# Patient Record
Sex: Male | Born: 1960 | Race: Black or African American | Hispanic: No | Marital: Married | Smoking: Former smoker
Health system: Southern US, Community
[De-identification: ages and names within clinical notes are randomized; demographics above are authoritative.]

## PROBLEM LIST (undated history)

## (undated) DIAGNOSIS — M109 Gout, unspecified: Secondary | ICD-10-CM

## (undated) DIAGNOSIS — D696 Thrombocytopenia, unspecified: Secondary | ICD-10-CM

## (undated) DIAGNOSIS — Z9289 Personal history of other medical treatment: Secondary | ICD-10-CM

## (undated) DIAGNOSIS — C61 Malignant neoplasm of prostate: Secondary | ICD-10-CM

## (undated) DIAGNOSIS — Z9359 Other cystostomy status: Secondary | ICD-10-CM

## (undated) DIAGNOSIS — F1020 Alcohol dependence, uncomplicated: Secondary | ICD-10-CM

## (undated) DIAGNOSIS — N400 Enlarged prostate without lower urinary tract symptoms: Secondary | ICD-10-CM

## (undated) DIAGNOSIS — N493 Fournier gangrene: Secondary | ICD-10-CM

## (undated) DIAGNOSIS — C91Z0 Other lymphoid leukemia not having achieved remission: Secondary | ICD-10-CM

## (undated) DIAGNOSIS — D509 Iron deficiency anemia, unspecified: Secondary | ICD-10-CM

## (undated) DIAGNOSIS — I10 Essential (primary) hypertension: Secondary | ICD-10-CM

## (undated) DIAGNOSIS — K259 Gastric ulcer, unspecified as acute or chronic, without hemorrhage or perforation: Secondary | ICD-10-CM

## (undated) DIAGNOSIS — N529 Male erectile dysfunction, unspecified: Secondary | ICD-10-CM

## (undated) DIAGNOSIS — C9100 Acute lymphoblastic leukemia not having achieved remission: Secondary | ICD-10-CM

## (undated) DIAGNOSIS — E43 Unspecified severe protein-calorie malnutrition: Secondary | ICD-10-CM

## (undated) HISTORY — PX: PROSTATE BIOPSY: SHX241

---

## 2016-11-05 DIAGNOSIS — N493 Fournier gangrene: Secondary | ICD-10-CM

## 2016-11-05 HISTORY — DX: Fournier gangrene: N49.3

## 2016-11-05 HISTORY — PX: SCROTAL SURGERY: SHX2387

## 2016-11-05 HISTORY — PX: SUPRAPUBIC CATHETER PLACEMENT: SHX2473

## 2016-11-05 HISTORY — PX: COLOSTOMY: SHX63

## 2016-12-14 ENCOUNTER — Observation Stay (HOSPITAL_COMMUNITY): Payer: Managed Care, Other (non HMO)

## 2016-12-14 ENCOUNTER — Observation Stay (HOSPITAL_COMMUNITY)
Admission: AD | Admit: 2016-12-14 | Discharge: 2017-01-03 | Disposition: E | Payer: Managed Care, Other (non HMO) | Source: Other Acute Inpatient Hospital | Attending: Internal Medicine | Admitting: Internal Medicine

## 2016-12-14 ENCOUNTER — Encounter (HOSPITAL_COMMUNITY): Payer: Self-pay | Admitting: General Practice

## 2016-12-14 DIAGNOSIS — J439 Emphysema, unspecified: Secondary | ICD-10-CM | POA: Diagnosis not present

## 2016-12-14 DIAGNOSIS — N360 Urethral fistula: Secondary | ICD-10-CM | POA: Diagnosis not present

## 2016-12-14 DIAGNOSIS — E871 Hypo-osmolality and hyponatremia: Secondary | ICD-10-CM | POA: Insufficient documentation

## 2016-12-14 DIAGNOSIS — C9101 Acute lymphoblastic leukemia, in remission: Secondary | ICD-10-CM | POA: Insufficient documentation

## 2016-12-14 DIAGNOSIS — I10 Essential (primary) hypertension: Secondary | ICD-10-CM | POA: Diagnosis not present

## 2016-12-14 DIAGNOSIS — D509 Iron deficiency anemia, unspecified: Secondary | ICD-10-CM | POA: Insufficient documentation

## 2016-12-14 DIAGNOSIS — R0602 Shortness of breath: Secondary | ICD-10-CM

## 2016-12-14 DIAGNOSIS — Z66 Do not resuscitate: Secondary | ICD-10-CM | POA: Insufficient documentation

## 2016-12-14 DIAGNOSIS — F1020 Alcohol dependence, uncomplicated: Secondary | ICD-10-CM | POA: Insufficient documentation

## 2016-12-14 DIAGNOSIS — Z8719 Personal history of other diseases of the digestive system: Secondary | ICD-10-CM | POA: Diagnosis not present

## 2016-12-14 DIAGNOSIS — H4921 Sixth [abducent] nerve palsy, right eye: Secondary | ICD-10-CM | POA: Diagnosis not present

## 2016-12-14 DIAGNOSIS — Z95828 Presence of other vascular implants and grafts: Secondary | ICD-10-CM | POA: Diagnosis not present

## 2016-12-14 DIAGNOSIS — N400 Enlarged prostate without lower urinary tract symptoms: Secondary | ICD-10-CM | POA: Diagnosis not present

## 2016-12-14 DIAGNOSIS — D696 Thrombocytopenia, unspecified: Secondary | ICD-10-CM | POA: Insufficient documentation

## 2016-12-14 DIAGNOSIS — F4323 Adjustment disorder with mixed anxiety and depressed mood: Secondary | ICD-10-CM | POA: Diagnosis not present

## 2016-12-14 DIAGNOSIS — E43 Unspecified severe protein-calorie malnutrition: Secondary | ICD-10-CM | POA: Insufficient documentation

## 2016-12-14 DIAGNOSIS — Z9221 Personal history of antineoplastic chemotherapy: Secondary | ICD-10-CM | POA: Insufficient documentation

## 2016-12-14 DIAGNOSIS — R29898 Other symptoms and signs involving the musculoskeletal system: Secondary | ICD-10-CM

## 2016-12-14 DIAGNOSIS — E876 Hypokalemia: Secondary | ICD-10-CM | POA: Insufficient documentation

## 2016-12-14 DIAGNOSIS — Z87891 Personal history of nicotine dependence: Secondary | ICD-10-CM | POA: Insufficient documentation

## 2016-12-14 DIAGNOSIS — Z933 Colostomy status: Secondary | ICD-10-CM | POA: Insufficient documentation

## 2016-12-14 DIAGNOSIS — G9340 Encephalopathy, unspecified: Secondary | ICD-10-CM | POA: Diagnosis not present

## 2016-12-14 DIAGNOSIS — R092 Respiratory arrest: Secondary | ICD-10-CM | POA: Diagnosis present

## 2016-12-14 DIAGNOSIS — N493 Fournier gangrene: Secondary | ICD-10-CM | POA: Diagnosis not present

## 2016-12-14 DIAGNOSIS — M109 Gout, unspecified: Secondary | ICD-10-CM | POA: Diagnosis not present

## 2016-12-14 DIAGNOSIS — Z8546 Personal history of malignant neoplasm of prostate: Secondary | ICD-10-CM | POA: Insufficient documentation

## 2016-12-14 DIAGNOSIS — F1010 Alcohol abuse, uncomplicated: Secondary | ICD-10-CM | POA: Insufficient documentation

## 2016-12-14 HISTORY — DX: Other cystostomy status: Z93.59

## 2016-12-14 HISTORY — DX: Unspecified severe protein-calorie malnutrition: E43

## 2016-12-14 HISTORY — DX: Malignant neoplasm of prostate: C61

## 2016-12-14 HISTORY — DX: Benign prostatic hyperplasia without lower urinary tract symptoms: N40.0

## 2016-12-14 HISTORY — DX: Gastric ulcer, unspecified as acute or chronic, without hemorrhage or perforation: K25.9

## 2016-12-14 HISTORY — DX: Gout, unspecified: M10.9

## 2016-12-14 HISTORY — DX: Male erectile dysfunction, unspecified: N52.9

## 2016-12-14 HISTORY — DX: Essential (primary) hypertension: I10

## 2016-12-14 HISTORY — DX: Personal history of other medical treatment: Z92.89

## 2016-12-14 HISTORY — DX: Alcohol dependence, uncomplicated: F10.20

## 2016-12-14 HISTORY — DX: Iron deficiency anemia, unspecified: D50.9

## 2016-12-14 HISTORY — DX: Acute lymphoblastic leukemia not having achieved remission: C91.00

## 2016-12-14 HISTORY — DX: Thrombocytopenia, unspecified: D69.6

## 2016-12-14 HISTORY — DX: Fournier gangrene: N49.3

## 2016-12-14 HISTORY — DX: Other lymphoid leukemia not having achieved remission: C91.Z0

## 2016-12-14 LAB — COMPREHENSIVE METABOLIC PANEL
ALT: 9 U/L — ABNORMAL LOW (ref 17–63)
AST: 17 U/L (ref 15–41)
Albumin: 3 g/dL — ABNORMAL LOW (ref 3.5–5.0)
Alkaline Phosphatase: 71 U/L (ref 38–126)
Anion gap: 10 (ref 5–15)
BILIRUBIN TOTAL: 0.6 mg/dL (ref 0.3–1.2)
CO2: 27 mmol/L (ref 22–32)
Calcium: 8.8 mg/dL — ABNORMAL LOW (ref 8.9–10.3)
Chloride: 91 mmol/L — ABNORMAL LOW (ref 101–111)
Creatinine, Ser: 0.46 mg/dL — ABNORMAL LOW (ref 0.61–1.24)
Glucose, Bld: 102 mg/dL — ABNORMAL HIGH (ref 65–99)
POTASSIUM: 3.1 mmol/L — AB (ref 3.5–5.1)
Sodium: 128 mmol/L — ABNORMAL LOW (ref 135–145)
TOTAL PROTEIN: 5.9 g/dL — AB (ref 6.5–8.1)

## 2016-12-14 LAB — CBC WITH DIFFERENTIAL/PLATELET
BASOS ABS: 0 10*3/uL (ref 0.0–0.1)
Basophils Relative: 0 %
EOS ABS: 0.5 10*3/uL (ref 0.0–0.7)
EOS PCT: 5 %
HCT: 32.7 % — ABNORMAL LOW (ref 39.0–52.0)
Hemoglobin: 10.8 g/dL — ABNORMAL LOW (ref 13.0–17.0)
LYMPHS PCT: 9 %
Lymphs Abs: 0.9 10*3/uL (ref 0.7–4.0)
MCH: 26.4 pg (ref 26.0–34.0)
MCHC: 33 g/dL (ref 30.0–36.0)
MCV: 80 fL (ref 78.0–100.0)
Monocytes Absolute: 2.3 10*3/uL — ABNORMAL HIGH (ref 0.1–1.0)
Monocytes Relative: 24 %
NEUTROS PCT: 63 %
Neutro Abs: 6.2 10*3/uL (ref 1.7–7.7)
PLATELETS: 65 10*3/uL — AB (ref 150–400)
RBC: 4.09 MIL/uL — AB (ref 4.22–5.81)
RDW: 15 % (ref 11.5–15.5)
WBC: 9.9 10*3/uL (ref 4.0–10.5)

## 2016-12-14 LAB — PROTIME-INR
INR: 1.07
PROTHROMBIN TIME: 14 s (ref 11.4–15.2)

## 2016-12-14 LAB — MRSA PCR SCREENING: MRSA by PCR: NEGATIVE

## 2016-12-14 LAB — TSH: TSH: 1.571 u[IU]/mL (ref 0.350–4.500)

## 2016-12-14 LAB — MAGNESIUM: MAGNESIUM: 1.7 mg/dL (ref 1.7–2.4)

## 2016-12-14 MED ORDER — ONDANSETRON HCL 4 MG PO TABS: 4.0000 mg | ORAL_TABLET | Freq: Four times a day (QID) | ORAL | Status: DC | PRN

## 2016-12-14 MED ORDER — FERROUS SULFATE 325 (65 FE) MG PO TABS
325.0000 mg | ORAL_TABLET | Freq: Every day | ORAL | Status: DC
  Administered 2016-12-15: 325 mg via ORAL
  Filled 2016-12-14: qty 1

## 2016-12-14 MED ORDER — OXYCODONE HCL 5 MG PO TABS
10.0000 mg | ORAL_TABLET | Freq: Four times a day (QID) | ORAL | Status: DC | PRN
  Administered 2016-12-15 – 2016-12-16 (×3): 10 mg via ORAL
  Filled 2016-12-14 (×4): qty 2

## 2016-12-14 MED ORDER — ENSURE ENLIVE PO LIQD
237.0000 mL | Freq: Three times a day (TID) | ORAL | Status: DC
  Administered 2016-12-15 (×3): 237 mL via ORAL

## 2016-12-14 MED ORDER — CEPHALEXIN 500 MG PO CAPS
500.0000 mg | ORAL_CAPSULE | Freq: Three times a day (TID) | ORAL | Status: DC
  Administered 2016-12-14 – 2016-12-15 (×4): 500 mg via ORAL
  Filled 2016-12-14 (×4): qty 1

## 2016-12-14 MED ORDER — HEPARIN SODIUM (PORCINE) 5000 UNIT/ML IJ SOLN
5000.0000 [IU] | Freq: Three times a day (TID) | INTRAMUSCULAR | Status: DC
  Administered 2016-12-14 – 2016-12-15 (×2): 5000 [IU] via SUBCUTANEOUS
  Filled 2016-12-14 (×2): qty 1

## 2016-12-14 MED ORDER — ACETAMINOPHEN 650 MG RE SUPP: 650.0000 mg | Freq: Four times a day (QID) | RECTAL | Status: DC | PRN

## 2016-12-14 MED ORDER — SACCHAROMYCES BOULARDII 250 MG PO CAPS
500.0000 mg | ORAL_CAPSULE | Freq: Two times a day (BID) | ORAL | Status: DC
  Administered 2016-12-14 – 2016-12-15 (×3): 500 mg via ORAL
  Filled 2016-12-14 (×3): qty 2

## 2016-12-14 MED ORDER — VITAMIN D 1000 UNITS PO TABS
1000.0000 [IU] | ORAL_TABLET | Freq: Every day | ORAL | Status: DC
  Administered 2016-12-15: 1000 [IU] via ORAL
  Filled 2016-12-14: qty 1

## 2016-12-14 MED ORDER — ALPRAZOLAM 0.5 MG PO TABS
0.5000 mg | ORAL_TABLET | Freq: Two times a day (BID) | ORAL | Status: DC | PRN
  Administered 2016-12-16: 0.5 mg via ORAL
  Filled 2016-12-14 (×2): qty 1

## 2016-12-14 MED ORDER — ACETAMINOPHEN 325 MG PO TABS
650.0000 mg | ORAL_TABLET | Freq: Four times a day (QID) | ORAL | Status: DC | PRN
  Administered 2016-12-15: 650 mg via ORAL
  Filled 2016-12-14: qty 2

## 2016-12-14 MED ORDER — SERTRALINE HCL 50 MG PO TABS
25.0000 mg | ORAL_TABLET | Freq: Every day | ORAL | Status: DC
  Administered 2016-12-15: 25 mg via ORAL
  Filled 2016-12-14: qty 1

## 2016-12-14 MED ORDER — LEVOFLOXACIN 500 MG PO TABS
500.0000 mg | ORAL_TABLET | Freq: Every day | ORAL | Status: DC
  Administered 2016-12-15: 500 mg via ORAL
  Filled 2016-12-14 (×2): qty 1

## 2016-12-14 MED ORDER — ONDANSETRON HCL 4 MG/2ML IJ SOLN: 4.0000 mg | Freq: Four times a day (QID) | INTRAMUSCULAR | Status: DC | PRN

## 2016-12-14 MED ORDER — SODIUM CHLORIDE 0.9% FLUSH
10.0000 mL | INTRAVENOUS | Status: DC | PRN
  Administered 2016-12-14 – 2016-12-15 (×2): 10 mL
  Filled 2016-12-14 (×2): qty 40

## 2016-12-14 MED ORDER — POLYETHYLENE GLYCOL 3350 17 G PO PACK: 17.0000 g | PACK | Freq: Every day | ORAL | Status: DC | PRN

## 2016-12-14 MED ORDER — LORAZEPAM 1 MG PO TABS
1.0000 mg | ORAL_TABLET | Freq: Once | ORAL | Status: AC | PRN
  Administered 2016-12-14: 1 mg via ORAL
  Filled 2016-12-14: qty 1

## 2016-12-14 NOTE — Progress Notes (Signed)
Patient admitted to 5M14. Patient is alert, oriented to self and place, disoriented to year but knows it is February.  IV team consulted for right chest port a cath. WOC consulted for wound on scrotum. Facility states they are currently keeping wound clean and placing abd pads and mesh underwear. Wound was covered with moist gauze, abdominal dressing on top and mesh underwear to keep dressing on. Admissions and Dr. Loleta Books notified.

## 2016-12-14 NOTE — H&P (Signed)
History and Physical    Philip Fletcher [id number] DOB: 11/10/1960 DOA: 12/22/2016  PCP: Buckner Malta, MD   Patient coming from: [facility]  Chief Complaint: bilateral upper extremities weakness  HPI: Philip Fletcher is a 56 y.o. male with medical history significant of Fournier gangrene, rectourethral fistula, s/p indwelling suprapubic catheter, T-cell leukemia, HTN, alcoholism, gout, PUD, prostate CA, Iron deficiency anemia who was transferred to the [facility] d/t new onset bilateral arm weakness, right >left. Patient was diagnosed with Fournier scrotal gangrene January complicated by sepsis and necrotizing fasciatis,hospitalized to [facility] and  underwent two debridement by urology. He was treated with broad spectrum antibiotics IV - Rocephin and Flagyl, but was improving slowly. His has suprapubic cathter d/t  rectourethral fistula. Patient reported that 2-3 days ago he noticed that his arms are getting progressively weak and today the weakness was more than before to the point that he is unable to use the right arm. He was transferred to the [facility] for further work up of his symptoms   Review of Systems: He complained of arm weakness, mild abdominal pain, groin / scrotal pain, mild nausea, no diarrhea, no headache, neck ache. As per HPI otherwise 10 point review of systems negative.   Ambulatory Status: bedridden  Past Medical History:  Diagnosis Date  . Alcoholism ([place])    Archie Endo dated 12/29/2016  . BPH (benign prostatic hyperplasia)    Archie Endo dated 12/29/2016  . BPH (benign prostatic hyperplasia)   . Erectile dysfunction    /notes dated 12/29/2016  . Fournier's gangrene of scrotum 11/2016   scrotum and perineum/notes dated 12/25/2016  . Gastric ulcer    /notes dated 12/23/2016  . Gout    /notes dated 12/27/2016  . History of blood transfusion    "several; related to cancer" (12/06/2016)  . Hypertension   . Iron deficiency anemia    /notes dated 12/25/2016  . Iron  deficiency anemia   . Prostate cancer ([place]) dxd ~ 2014  . Severe protein-calorie malnutrition ([place])    Archie Endo dated 12/12/2016  . Suprapubic catheter ([place])    secondary to rectal urethral fistula/notes dated 12/25/2016  . 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 T-cell leukemia ([place])   . Thrombocytopenia ([place])    Archie Endo dated 12/15/2016    Past Surgical History:  Procedure Laterality Date  . COLOSTOMY  11/2016   diverting colostomy for rectourethral fistula/notes dated 12/08/2016  . PROSTATE BIOPSY  ~ 2014  . SCROTAL SURGERY  11/2016   extensive excision and debridement/notes dated 12/11/2016  . SUPRAPUBIC CATHETER PLACEMENT  11/2016    Social History   Social History  . Marital status: Married    Spouse name: N/A  . Number of children: N/A  . Years of education: N/A   Occupational History  . Not on file.   Social History Main Topics  . Smoking status: Former Smoker    Years: 42.00    Types: Cigarettes    Quit date: 09/05/2016  . Smokeless tobacco: Never Used     Comment: 12/12/2016 "never smoked more than 1 or 2 cigarettes/day"  . Alcohol use No  . Drug use: No  . Sexual activity: Not on file   Other Topics Concern  . Not on file   Social History Narrative  . No narrative on file    No Known Allergies  History reviewed. No pertinent family history.  Prior to Admission medications   Medication  Sig Start Date End Date Taking? Authorizing Provider  ALPRAZolam Duanne Moron) 0.5 MG tablet Take 0.5 mg by mouth every 12 (twelve) hours as needed for anxiety.   Yes Historical Provider, MD  alum & mag hydroxide-simeth (MAALOX/MYLANTA) 200-200-20 MG/5ML suspension Take 30 mLs by mouth every 6 (six) hours as needed for indigestion or heartburn.   Yes Historical Provider, MD  cephALEXin (KEFLEX) 500 MG capsule Take 500 mg by mouth every 8 (eight) hours.   Yes Historical Provider, MD  ENSURE PLUS (ENSURE  PLUS) LIQD Take 237 mLs by mouth 3 (three) times daily with meals.   Yes Historical Provider, MD  ferrous sulfate 325 (65 FE) MG tablet Take 325 mg by mouth daily with breakfast.   Yes Historical Provider, MD  levofloxacin (LEVAQUIN) 500 MG tablet Take 500 mg by mouth daily.   Yes Historical Provider, MD  oxyCODONE (OXY IR/ROXICODONE) 5 MG immediate release tablet Take 10 mg by mouth every 6 (six) hours as needed for severe pain.   Yes Historical Provider, MD  saccharomyces boulardii (FLORASTOR) 250 MG capsule Take 500 mg by mouth 2 (two) times daily.   Yes Historical Provider, MD  sertraline (ZOLOFT) 25 MG tablet Take 25 mg by mouth daily.   Yes Historical Provider, MD  Vitamin D, Cholecalciferol, 1000 units TABS Take 1,000 Units by mouth daily.   Yes Historical Provider, MD    Physical Exam: Vitals:   12/13/2016 1530  BP: (!) 156/101  Pulse: 93  Resp: 16  Temp: 98.9 F (37.2 C)  TempSrc: Oral  SpO2: 97%  Weight: 58.6 kg (129 lb 3 oz)     General: Appears calm and comfortable Eyes: PERRLA, EOMI, normal lids, iris ENT:  grossly normal hearing, lips & tongue, mucous membranes moist and intact Neck: no lymphoadenopathy, masses or thyromegaly Cardiovascular: RRR, no m/r/g. No JVD, carotid bruits. No LE edema.  Respiratory: bilateral no wheezes, rales, rhonchi or cracles. Normal respiratory effort. No accessory muscle use observed Abdomen: scaphoid, soft, non-tender, non-distended, no organomegaly or masses appreciated. BS present in all quadrants, but diminished, suprapubic catheter in place Skin: no rash, ulcers or induration seen on limited exam, dressing applied over perineal area Musculoskeletal: grossly normal tone BLE with good ROM, no bony abnormality or joint deformities observed. BUE are weak, left >right and patient is almost unable to move his right arm Psychiatric: depressed mood and flat affect, speech fluent, quiet and appropriate, alert and oriented x3 Neurologic: CN II-XII  grossly intact, moves all extremities in coordinated fashion, sensation intact  Labs on Admission: I have personally reviewed following labs and imaging studies  CBC, BMP  GFR: CrCl cannot be calculated (No order found.).   Creatinine Clearance: CrCl cannot be calculated (No order found.).   Radiological Exams on Admission: No results found.  EKG: not found  Assessment/Plan Principal Problem:   Weakness of both upper extremities Active Problems:   Fournier's gangrene of scrotum   Rectourethral fistula   Severe protein-calorie malnutrition (HCC)   Adjustment disorder with mixed anxiety and depressed mood   Iron deficiency anemia    Upper extremities weakness - right >left Will order brain MRI/MRA and if negative, proceed with neck MRI  Fournier gangrene Continue antibiotics as before - Levaquin and Keflex and monitor WBC's count  Rectourethral fistula Patient has suprapubic caheter, monitor UOP and observe for signs of infection  Depression / Anxiety Continue Xanax as before Continue Zoloft and monitor for mood swings  Anemia - iron deficiency Continue supplemental Iron and  monitor Hgb  Severe protein calorie malnutrition Continue supplemental nutrition   DVT prophylaxis: heparin Code Status: Full Family Communication: none Disposition Plan: MedSurg Consults called: neuro paged Admission status: observation   York Grice, PA-C Pager: [phone] [facility]  If 7PM-7AM, please contact night-coverage [email or web address] Password [email or web address]  01/02/2017, 6:12 PM

## 2016-12-14 NOTE — Progress Notes (Signed)
Patient came with a port cath, received a call for IV team that CXR was needed to confirm the tip. MD called to notify

## 2016-12-14 NOTE — Consult Note (Signed)
NEURO HOSPITALIST CONSULT NOTE   Requestig physician: Dr. Loleta Books  Reason for Consult: Possible stroke  History obtained from:  Patient, Family and Chart     HPI:                                                                                                                                          Philip Fletcher is an 56 y.o. male who presents with 7 day history of progressive LUE weakness and 3 day history of progressive RUE weakness. He reported that his arms were getting progressively weaker and that the weakness on Thursday was significantly worsened to the point that he was no longer able to use his right arm; left arm was also weak but not as severe as on right.    He has a PMHx significant for T-cell leukemia. He has Fournier gangrene diagnosed in January that was complicated by sepsis and necrotizing fascitis s/p debridement x 2 by Urology. Also with HTN, alcoholism, gout, prostate CA and iron deficiency anemia.   Has had progressive 40 lb weight loss since July.   Past Medical History:  Diagnosis Date  . Alcoholism ([place])    Archie Endo dated 12/09/2016  . BPH (benign prostatic hyperplasia)    Archie Endo dated 12/24/2016  . BPH (benign prostatic hyperplasia)   . Erectile dysfunction    /notes dated 12/29/2016  . Fournier's gangrene of scrotum 11/2016   scrotum and perineum/notes dated 01/02/2017  . Gastric ulcer    /notes dated 12/26/2016  . Gout    /notes dated 12/23/2016  . History of blood transfusion    "several; related to cancer" (12/12/2016)  . Hypertension   . Iron deficiency anemia    /notes dated 12/23/2016  . Iron deficiency anemia   . Prostate cancer ([place]) dxd ~ 2014  . Severe protein-calorie malnutrition ([place])    Archie Endo dated 12/12/2016  . Suprapubic catheter ([place])    secondary to rectal urethral fistula/notes dated 12/18/2016  . 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 T-cell  leukemia ([place])   . Thrombocytopenia ([place])    Archie Endo dated 12/24/2016    Past Surgical History:  Procedure Laterality Date  . COLOSTOMY  11/2016   diverting colostomy for rectourethral fistula/notes dated 12/13/2016  . PROSTATE BIOPSY  ~ 2014  . SCROTAL SURGERY  11/2016   extensive excision and debridement/notes dated 12/31/2016  . SUPRAPUBIC CATHETER PLACEMENT  11/2016   History reviewed. No pertinent family history.  Social History:  reports that he quit smoking about 3 months ago. His smoking use included Cigarettes. He quit after 42.00 years of use. He has never used smokeless tobacco. He reports that he does not drink alcohol or use drugs.  No  Known Allergies  MEDICATIONS:                                                                                                                      Current Facility-Administered Medications:  .  acetaminophen (TYLENOL) tablet 650 mg, 650 mg, Oral, Q6H PRN **OR** acetaminophen (TYLENOL) suppository 650 mg, 650 mg, Rectal, Q6H PRN, Brenton Grills, PA-C .  ALPRAZolam Duanne Moron) tablet 0.5 mg, 0.5 mg, Oral, Q12H PRN, Brenton Grills, PA-C .  cephALEXin (KEFLEX) capsule 500 mg, 500 mg, Oral, Q8H, Marina Chauncey Cruel [place], PA-C, 500 mg at 12/07/2016 2149 .  cholecalciferol (VITAMIN D) tablet 1,000 Units, 1,000 Units, Oral, Daily, Marina S Kyazimova, PA-C .  feeding supplement (ENSURE ENLIVE) (ENSURE ENLIVE) liquid 237 mL, 237 mL, Oral, TID WC, Marina S Kyazimova, PA-C .  ferrous sulfate tablet 325 mg, 325 mg, Oral, Q breakfast, Marina S [place], PA-C .  heparin injection 5,000 Units, 5,000 Units, Subcutaneous, Q8H, Brenton Grills, [place], 5,000 Units at 01/02/2017 2149 .  levofloxacin (LEVAQUIN) tablet 500 mg, 500 mg, Oral, Daily, Marina S Kyazimova, PA-C .  ondansetron (ZOFRAN) tablet 4 mg, 4 mg, Oral, Q6H PRN **OR** ondansetron (ZOFRAN) injection 4 mg, 4 mg, Intravenous, Q6H PRN, Brenton Grills, PA-C .  oxyCODONE (Oxy IR/ROXICODONE) immediate release  tablet 10 mg, 10 mg, Oral, Q6H PRN, Brenton Grills, PA-C .  polyethylene glycol (MIRALAX / GLYCOLAX) packet 17 g, 17 g, Oral, Daily PRN, Brenton Grills, PA-C .  saccharomyces boulardii (FLORASTOR) capsule 500 mg, 500 mg, Oral, BID, Brenton Grills, PA-C, 500 mg at 12/26/2016 2149 .  sertraline (ZOLOFT) tablet 25 mg, 25 mg, Oral, Daily, Marina S Kyazimova, PA-C .  sodium chloride flush (NS) 0.9 % injection 10-40 mL, 10-40 mL, Intracatheter, PRN, Edwin Dada, MD, 10 mL at 12/13/2016 1937  ROS:                                                                                                                                       History obtained from patient. No headache, neck pain, chest pain, SOB, abdominal pain or limb pain. The patient also denies scrotal pain.   Blood pressure (!) 156/101, pulse 93, temperature 98.9 F (37.2 C), temperature source Oral, resp. rate 16, weight 58.6 kg (129 lb 3 oz), SpO2 97 %.  General Examination:  HEENT-  Normocephalic/atraumatic. Lungs- Shallow breaths without gross wheezing.  Integument: Diffuse cachexia.  Extremities- No edema.   Neurological Examination Mental Status: Drowsy to somnolent. Oriented to self, city, state and day but not year. Increased latency of verbal responses. Appears fatigued. Intact comprehension for all simple motor commands. Becomes confused with 2-step motor commands and basic arithmetic. Speech is fluent without dysarthria. Naming intact.  Cranial Nerves: II: OD: left superior quadrantanopsia. OS: Visual fields intact. PERRL.  III,IV, VI: Bilateral mild ptosis. Left eye with full EOM except impaired downgaze. Right eye with impaired downgaze and lateral rectus palsy. No nystagmus.   V,VII: smile symmetric, facial temperature sensation normal bilaterally VIII: hearing intact to questions and commands IX,X: Marked  hypophonia noted.  XI: Weak bilaterally.  XII: midline tongue extension Motor:  Diffuse loss of muscle bulk, all 4 extremities, with mildly decreased tone x 4.  LUE: 4/5 grip, 4/5 triceps, 2/5 biceps, 1/5 deltoid.  RUE: 2/5 grip, 1/5 triceps, biceps and deltoid.  LLE: 3/5 hip flexion, 4/5 knee extension/flexion, 4/5 ankle dorsi/plantar flexion.  RLE: 3/5 hip flexion, 4/5 knee extension/flexion, 4/5 ankle dorsi/plantar flexion. Sensory: Temperature and light touch intact in all 4 extremities. No extinction.  Deep Tendon Reflexes: 1+ brachioradialis bilaterally, 0 biceps bilaterally. 2+ patellae bilaterally, 1+ achlles bilaterally. Toes equivocal.  Cerebellar: Unable to perform FNF with RUE or LUE secondary to weakness. No ataxia noted on examination of lower extremities.  Gait: Unable to assess.    Lab Results: Basic Metabolic Panel: No results for input(s): NA, K, CL, CO2, GLUCOSE, BUN, CREATININE, CALCIUM, MG, PHOS in the last 168 hours.  Liver Function Tests: No results for input(s): AST, ALT, ALKPHOS, BILITOT, PROT, ALBUMIN in the last 168 hours. No results for input(s): LIPASE, AMYLASE in the last 168 hours. No results for input(s): AMMONIA in the last 168 hours.  CBC: No results for input(s): WBC, NEUTROABS, HGB, HCT, MCV, PLT in the last 168 hours.  Cardiac Enzymes: No results for input(s): CKTOTAL, CKMB, CKMBINDEX, TROPONINI in the last 168 hours.  Lipid Panel: No results for input(s): CHOL, TRIG, HDL, CHOLHDL, VLDL, LDLCALC in the last 168 hours.  CBG: No results for input(s): GLUCAP in the last 168 hours.  Microbiology: Results for orders placed or performed during the hospital encounter of 12/30/2016  MRSA PCR Screening     Status: None   Collection Time: 12/18/2016  4:28 PM  Result Value Ref Range Status   MRSA by PCR NEGATIVE NEGATIVE Final    Comment:        The GeneXpert MRSA Assay (FDA approved for NASAL specimens only), is one component of a comprehensive MRSA  colonization surveillance program. It is not intended to diagnose MRSA infection nor to guide or monitor treatment for MRSA infections.     Coagulation Studies: No results for input(s): LABPROT, INR in the last 72 hours.  Imaging: No results found.   Assessment: 1. 2. Exam finding of mild confusion and slowed mentation. Consistent with encephalopathy. DDx includes carcinomatous meningitis secondary to leukemia.  3. Exam findings of bilateral mild ptosis, right lateral rectus palsy and impaired downgaze OU. Localization: Most likely corresponding cranial nerve roots (right CN VI and bilateral CN III) given MRI negative for stroke. DDx includes carcinomatous meningitis secondary to leukemia.   4. Exam findings of severe bilateral upper extremity weakness with preserved left grip strength. 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 Without the benefit of c-spine MRI, the localization/DDX would include ventral epidural abscess, anterior spinal  cord trauma, bilateral brain infarcts in border zones (e.g. watershed infarcts of middle and anterior cerebral artery distributions, central pontine myelinolysis, and decussation of the pyramids). However, all of these have been ruled out with MRI. See #8 below for most likely remaining components of DDx.   5.  MRI brain reveals no acute intracranial abnormality. Mild chronic microvascular ischemia and mild age advanced cerebral atrophy are noted.  6. MRA head essentially unremarkable. There is a 2 mm outpouching from the communicating segment of the left internal carotid artery, likely an infundibulum. 7. MRI cervical spine reveals mild foraminal narrowing at C6-C7 bilaterally due to uncovertebral hypertrophy. Otherwise normal MRI of cervical spine.  8. 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 Possible underlying etiologies would  include mass effect from bilateral lung apex masses or infection, autoimmune plexopathy and infiltrative process.  9. Unfortunately, contrast was not administered with MRI to r/o metastatic infiltrative process.   Recommendations: 1. First priority would be sending back to MRI for postcontrast imaging of orbits, brain and cervical spine to assess for possible infiltrative process given his diagnosis of T-cell lymphoma. If infiltrative process is seen on MRI, correlate with LP findings (see below).  2. Following obtaining post contrast MRI images, if they are uninformative obtain CT of lung apices, thoracic inlet and lower neck, with and without contrast. 3. If CT neck/chest is negative, obtain MRI of brachial plexus bilaterally, with and without contrast.  4. Lumbar puncture under fluoroscopy. Obtain cell count with differential, protein, glucose, IgG, albumin, cytology (extra 5 cc of CSF required), flow cytometry (an additional 5 cc) and VZV PCR. Also obtain concomitant serum IgG and albumin so that CSF IgG synthesis rate can be calculated.  5. Call Neurology for further recommendations based upon results of the above studies. If autoimmune plexopathy is felt to be most likely, a trial of IVIG or high dose IV methylprednisolone should be considered.   Electronically signed: Dr. Kerney Elbe 12/08/2016, 7:32 PM

## 2016-12-15 DIAGNOSIS — R29898 Other symptoms and signs involving the musculoskeletal system: Secondary | ICD-10-CM | POA: Diagnosis not present

## 2016-12-15 DIAGNOSIS — R092 Respiratory arrest: Secondary | ICD-10-CM | POA: Diagnosis not present

## 2016-12-15 DIAGNOSIS — Z95828 Presence of other vascular implants and grafts: Secondary | ICD-10-CM

## 2016-12-15 LAB — BASIC METABOLIC PANEL
Anion gap: 10 (ref 5–15)
CHLORIDE: 91 mmol/L — AB (ref 101–111)
CO2: 26 mmol/L (ref 22–32)
Calcium: 8.7 mg/dL — ABNORMAL LOW (ref 8.9–10.3)
Creatinine, Ser: 0.49 mg/dL — ABNORMAL LOW (ref 0.61–1.24)
GFR calc Af Amer: >60 mL/min (ref >60)
GFR calc non Af Amer: >60 mL/min (ref >60)
GLUCOSE: 99 mg/dL (ref 65–99)
POTASSIUM: 2.9 mmol/L — AB (ref 3.5–5.1)
Sodium: 127 mmol/L — ABNORMAL LOW (ref 135–145)

## 2016-12-15 LAB — CBC
HEMATOCRIT: 32.6 % — AB (ref 39.0–52.0)
Hemoglobin: 10.7 g/dL — ABNORMAL LOW (ref 13.0–17.0)
MCH: 26.3 pg (ref 26.0–34.0)
MCHC: 32.8 g/dL (ref 30.0–36.0)
MCV: 80.1 fL (ref 78.0–100.0)
Platelets: 62 10*3/uL — ABNORMAL LOW (ref 150–400)
RBC: 4.07 MIL/uL — ABNORMAL LOW (ref 4.22–5.81)
RDW: 15.2 % (ref 11.5–15.5)
WBC: 10 10*3/uL (ref 4.0–10.5)

## 2016-12-15 MED ORDER — POTASSIUM CHLORIDE CRYS ER 20 MEQ PO TBCR
40.0000 meq | EXTENDED_RELEASE_TABLET | Freq: Two times a day (BID) | ORAL | Status: DC
  Administered 2016-12-15 (×2): 40 meq via ORAL
  Filled 2016-12-15 (×2): qty 2

## 2016-12-15 NOTE — Progress Notes (Addendum)
Initial Nutrition Assessment  DOCUMENTATION CODES:  Severe malnutrition in context of chronic illness   Pt meets criteria for SEVERE MALNUTRITION in the context of Chronic Illness as evidenced by loss of 20% bw in < 1 year and a estimated intake that has met </=75% of needs for >/= 1 month.  INTERVENTION:  Recommend Palliative Care consult. Pt has had profound nutritional decline x 6 months. Pt has more or less stopped eating. Family does not sound to want aggressive measures.   Magic cup BID with meals, each supplement provides 290 kcal and 9 grams of protein  Meal requests  NUTRITION DIAGNOSIS:  Inadequate oral intake related to chronic illness, cancer and cancer related treatments, poor appetite as evidenced by loss of 20% bw < 12 months  GOAL:  Patient will meet greater than or equal to 90% of their needs  MONITOR:  PO intake, Supplement acceptance, Labs, Skin (GOC)  REASON FOR ASSESSMENT:  Malnutrition Screening Tool    ASSESSMENT:  56 y/o male PMHx fournier gangrene, rectourethral fistula, T cell leukemia, HTN, ETOH abuse, Gout, PUD, Prostate Ca, anemia, colostomy. Presented with upper extremity weakness  Patient is asleep. Daughter is at bedside. She reports a very large decline over the last 6 months.   The patients PO intake has gradually decreased to the point of the patient eating only "bites" these past few days. She says pt's appetite has been slowly decreasing for a while now. Apparently, appetite stimulants had been offered to the patient in the past, but "he never gave them an answer". A major inhibitor to his intake is his taste. He says everything has a metallic taste. Daughter believes the pt only eats bites to make the family happy, and that is why the family does not push him to eat more.   The patient has relied highly on Ensure for the past 2 years. He used to drink 6 a day. Now the patient has "burned out" and will drink < 1 Ensure a day now.   Daughter  says the patient does not want aggressive measures, he prays and wants "Gods will to be done". Family would likely benefit from a palliative consult, as the patient and family sound to be leaning to a comfort care approach. Additionally, see the patient would like to go home and not back to SNF.  When the patient was diagnosed with prostate cancer 2 years ago, at which time he weighed 185 lbs. He was able to maintain his weight while undergoing treatment for a while. She says he weighed 170 in July. Per Care Everywhere, it appears his weight at the time was closer to 152 lbs. However, he weighed 163 lbs in mid April 2017, this reflects a 33 lb loss in < 1 year (20% bw)  At this time, the daughter did not want any aggressive nutritional interventions such as large quantity of supplements, rather, wanted patient to eat what he wants.   NFPE: Not assessed  Medications: Ensure Enlive, Vit D, Iron, Abx, KCL, Probiotic, opiate pain meds.  Labs: Albumin: 3.0, Hypokalemic,    Recent Labs Lab 12/19/2016 1924 12/15/16 0400  NA 128* 127*  K 3.1* 2.9*  CL 91* 91*  CO2 27 26  BUN <5* <5*  CREATININE 0.46* 0.49*  CALCIUM 8.8* 8.7*  MG 1.7  --   GLUCOSE 102* 99   Diet Order:  Diet regular Room service appropriate? Yes; Fluid consistency: Thin  Skin:  Reviewed, no issues  Last BM:  2/9- colosostomy  Height:  Ht Readings from Last 1 Encounters:  No data found for Ht   Weight:  Wt Readings from Last 1 Encounters:  12/24/2016 129 lb 3 oz (58.6 kg)   Ideal Body Weight: N/A  BMI:  There is no height or weight on file to calculate BMI.  Estimated Nutritional Needs:  N/A - patient/family desire more of comfort approach  EDUCATION NEEDS:  No education needs identified at this time  Burtis Junes RD, LDN, [facility] Pager: [phone] 12/15/2016 7:23 PM

## 2016-12-15 NOTE — Consult Note (Signed)
Urology Consult  Consulting MD:Dr I Doyle Askew  CC: Blood from penis  HPI: This is a 56 year old male admitted to [facility] for evaluation/management of neurologic process.  The patient has a significant urologic history:  History of prostate cancer, apparently status post placement of I-125 seeds approximately 2-3 years ago in [place], [place].  History of Fournier's gangrene, status post debridement 2-3 in [place], [place] in January of this year.  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  Apparently, the patient also has a rectal mass.  He underwent diverting colostomy at the time of his Fortier's gangrene treatment/rectourethral fistula.  The patient was noted to have blood coming from around his Foley/urethral catheter today.  This was not profuse bleeding.  Because of this, I was consulted by Dr. Doyle Askew.  The patient has been on heparin since he's been in the hospital.  He has had continuous bladder irrigation going prior to his admission here, when he was at the skilled nursing facility.  His urine was bloody up until 2-3 days ago.    PMH: Past Medical History:  Diagnosis Date  . Alcoholism ([place])    Archie Endo dated 12/19/2016  . BPH (benign prostatic hyperplasia)    Archie Endo dated 12/23/2016  . BPH (benign prostatic hyperplasia)   . Erectile dysfunction    /notes dated 12/10/2016  . Fournier's gangrene of scrotum 11/2016   scrotum and perineum/notes dated 12/07/2016  . Gastric ulcer    /notes dated 12/25/2016  . Gout    /notes dated 12/13/2016  . History of blood transfusion    "several; related to cancer" (12/29/2016)  . Hypertension   . Iron deficiency anemia    /notes dated 01/02/2017  . Iron deficiency anemia   . Prostate cancer ([place]) dxd ~ 2014  . Severe protein-calorie malnutrition ([place])    Archie Endo dated 12/11/2016  . Suprapubic catheter ([place])    secondary to rectal urethral fistula/notes dated 12/18/2016  . 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 T-cell leukemia ([place])   . Thrombocytopenia ([place])    Archie Endo dated 12/29/2016    PSH: Past Surgical History:  Procedure Laterality Date  . COLOSTOMY  11/2016   diverting colostomy for rectourethral fistula/notes dated 12/22/2016  . PROSTATE BIOPSY  ~ 2014  . SCROTAL SURGERY  11/2016   extensive excision and debridement/notes dated 12/06/2016  . SUPRAPUBIC CATHETER PLACEMENT  11/2016    Allergies: No Known Allergies  Medications: Prescriptions Prior to Admission  Medication Sig Dispense Refill Last Dose  . ALPRAZolam (XANAX) 0.5 MG tablet Take 0.5 mg by mouth every 12 (twelve) hours as needed for anxiety.    at prn  . alum & mag hydroxide-simeth (MAALOX/MYLANTA) 200-200-20 MG/5ML suspension Take 30 mLs by mouth every 6 (six) hours as needed for indigestion or heartburn.    at prn  . cephALEXin (KEFLEX) 500 MG capsule Take 500 mg by mouth every 8 (eight) hours.   12/13/2016 at 2240  . ENSURE PLUS (ENSURE PLUS) LIQD Take 237 mLs by mouth 3 (three) times daily with meals.   12/07/2016 at 0900  . ferrous sulfate 325 (65 FE) MG tablet Take 325 mg by mouth daily with breakfast.   12/31/2016 at 0800  . levofloxacin (LEVAQUIN) 500 MG tablet Take 500 mg by mouth daily.   12/27/2016 at 0930  . oxyCODONE (OXY IR/ROXICODONE) 5 MG  immediate release tablet Take 10 mg by mouth every 6 (six) hours as needed for severe pain.   12/13/2016 at 1640  . saccharomyces boulardii (FLORASTOR) 250 MG capsule Take 500 mg by mouth 2 (two) times daily.   12/20/2016 at 0930  . sertraline (ZOLOFT) 25 MG tablet Take 25 mg by mouth daily.   12/12/2016 at 0930  . Vitamin D, Cholecalciferol, 1000 units TABS Take 1,000 Units by mouth daily.   12/28/2016 at 0930     Social History: Social History   Social History  . Marital status: Married    Spouse name:  N/A  . Number of children: N/A  . Years of education: N/A   Occupational History  . Not on file.   Social History Main Topics  . Smoking status: Former Smoker    Years: 42.00    Types: Cigarettes    Quit date: 09/05/2016  . Smokeless tobacco: Never Used     Comment: 12/09/2016 "never smoked more than 1 or 2 cigarettes/day"  . Alcohol use No  . Drug use: No  . Sexual activity: Not on file   Other Topics Concern  . Not on file   Social History Narrative  . No narrative on file    Family History: History reviewed. No pertinent family history.  Review of Systems: Positive: Recent neurologic change.  The patient is/has been lethargic Negative:  A further 10 point review of systems was negative except what is listed in the HPI.  Physical Exam: @VITALS2 @ General: No acute distress.  Awake, arousable but lethargic. Head:  Normocephalic.  Atraumatic. ENT:  EOMI.  Mucous membranes moist Neck:  Supple.   CV:  Pulse regular Pulmonary: Equal effort bilaterally.   Abdomen: Soft.  Non- tender to palpation.  Suprapubic tube patent, minimal granulation tissue surrounding this.  There is fullness/mass in his left pubic area that apparently was not present before. Skin:  Normal turgor.  No visible rash. Extremity: No gross deformity of bilateral upper extremities.  No gross deformity of    bilateral lower extremities. Neurologic: Alert. Appropriate mood.  Penis:  Uncircumcised.  No lesions. Urethra: Foley catheter in place.  Orthotopic meatus.  There is no active bleeding. Scrotum: Posterior scrotal area healing well.  No active necrotic/infective process going on   Studies:  Recent Labs     12/27/2016  1924  12/15/16  0400  HGB  10.8*  10.7*  WBC  9.9  10.0  PLT  65*  62*    Recent Labs     12/29/2016  1924  12/15/16  0400  NA  128*  127*  K  3.1*  2.9*  CL  91*  91*  CO2  27  26  BUN  <5*  <5*  CREATININE  0.46*  0.49*  CALCIUM  8.8*  8.7*  GFRNONAA  >60  >60  GFRAA   >60  >60     Recent Labs     01/01/2017  1924  INR  1.07     Invalid input(s): ABG    Assessment/Plan:  1.  Bleeding around Foley catheter.  This has now stopped.  From what the family says, this has been minimal, but worrisome to them.  More than likely, this is from the patient having a rectourethral fistula, perhaps some other mass effect going on, having a Foley catheter, being thrombocytopenic and being on heparin.  At this point, I have very little to offer for this.  If it becomes profuse, the patient  will need cystoscopy and cauterization.  However, prior to that, I would consider stopping the blood thinner.  If this becomes profuse.  For now, I would recommend just placing 4 x 4's around the Foley catheter as I showed the patient's family to do.  2.  He does have recent Fournier's gangrene, and apparently some mass effect going on within his pelvis/rectal area.  This may be a secondary carcinoma, from what the patient's family says.  I did not perform a rectal exam today, as I would have very little to gain from this.  3.  I do not think that imaging is necessary at this time.  4.  Regarding his catheter drainage-I would continue both the suprapubic tube and the urethral catheter.  He has been on continuous bladder irrigation.  I do not think that this is necessary at the present time.  Certainly, if his hematuria recurs, it can be restarted.  5.  At this point, I have nothing further to offer.  Reconsult [place] if you have further issues.       Pager:[phone]

## 2016-12-15 NOTE — Consult Note (Signed)
[place] Nurse ostomy consult note Stoma type/location: LLQ colostomy Stomal assessment/size: Visualized through pouch, pouch was applied this am, red, raised, round, moist.  Os at center. Approximately 1 and 1/2 inches. Peristomal assessment: Mot seen today Treatment options for stomal/peristomal skin: Not seen today Output No stool in pouch.  I today emptied flatus from pouching system and demonstrated to caregiver (family) how to empty flatus from bottom of pouch.  They were unfamiliar with the Lock and Roll closure and I demonstrated this several times and she is able to give return demonstration x2.   Ostomy pouching: 2-piece 2 and 1/4 inches  Education provided: Ass above.  Care giver taught to perform Lock and Roll closure. Enrolled patient in [place] program: No   WOC Nurse wound consult note Reason for Consult: Full thickness tissue loss at left scrotum Wound type: moisture vs trauma vs infectious Pressure Injury POA: No Measurement: 4cm x 2.5cm x 0.1cm Wound bed:red, moist, free of necrotic tissue Drainage (amount, consistency, odor) serous to light yellow Periwound:intact, dry Dressing procedure/placement/frequency: I will provide nursing with Orders for topical care using xeroform gauze and topping with a feminine hygiene pad and securing with mesh briefs. [place] nursing team will not follow routinely, but will remain available to this patient, the nursing and medical teams.  Please re-consult if needed. Thanks, Maudie Flakes, MSN, RN, [place], Arther Abbott  Pager# [phone]

## 2016-12-15 NOTE — Progress Notes (Signed)
Patient was bleeding a small amount from the penis, also had a bloody mucoid stool. MD paged to notify

## 2016-12-15 NOTE — Progress Notes (Signed)
MD called back and told writer to continue to monitor patient. No new order at this time.

## 2016-12-15 NOTE — Progress Notes (Addendum)
Patient ID: Philip Fletcher, male   DOB: 1961-03-25, 56 y.o.   MRN: [id number]    PROGRESS NOTE  Elicio Gorga  [id number] DOB: [birth year] DOA: 12/09/2016  PCP: Buckner Malta, MD   Brief Narrative:  56 y.o. male with medical history significant of Fournier gangrene, rectourethral fistula, s/p indwelling suprapubic catheter, T-cell leukemia, HTN, alcoholism, gout, PUD, prostate CA, Iron deficiency anemia who was transferred to the [facility] d/t new onset bilateral arm weakness, right > left. Patient was diagnosed with Fournier scrotal gangrene in January complicated by sepsis and necrotizing fasciatis, hospitalized to [facility] and  underwent two debridement by urology. He was treated with broad spectrum antibiotics IV - Rocephin and Flagyl, but was improving slowly. His has suprapubic cathter d/t  rectourethral fistula.   Assessment & Plan:  Upper extremities weakness - right > left - no signs of stroke on MRI - neurology consulted  Fournier gangrene - Continue antibiotics as before - Levaquin and Keflex and monitor WBC's count  Rectourethral fistula - with some bleeding noted  - urology consulted, recommended stopping anticoagulation as pt will need cystoscopy  - urology recommends placing 4 x 4's around the Foley catheter - appreciate assistance   Hypokalemia - supplement, BMP in AM  Hyponatremia - monitor with BMP  Depression / Anxiety - Continue Xanax as before - Continue Zoloft and monitor for mood swings  Anemia - iron deficiency, thrombocytopenia  - Continue supplemental Iron and monitor Hgb  Severe protein calorie malnutrition - Continue supplemental nutrition  DVT prophylaxis: SCD's Code Status: Full  Family Communication: Patient and significant other at bedside  Disposition Plan: To be determined   Consultants:   Neurology  Urology   Procedures:   None  Antimicrobials:   Cephalexin 2/9 -->  Levaquin 2/9 -->  Subjective: Had some  rectal and penile bleeding.   Objective: Vitals:   12/15/16 0606 12/15/16 1015 12/15/16 1424 12/15/16 1658  BP: (!) 157/98 (!) 142/50 (!) 146/86 132/88  Pulse: 93 (!) 103 (!) 101 (!) 102  Resp: 20 20 20 18   Temp: 98.2 F (36.8 C) 98.6 F (37 C) 98.5 F (36.9 C) 98.1 F (36.7 C)  TempSrc: Oral Oral Oral Oral  SpO2: 98% 95% 97% 98%  Weight:        Intake/Output Summary (Last 24 hours) at 12/15/16 1807 Last data filed at 12/15/16 1552  Gross per 24 hour  Intake               20 ml  Output            12800 ml  Net           -12780 ml   Filed Weights   12/18/2016 1530  Weight: 58.6 kg (129 lb 3 oz)    Examination:  General exam: Appears calm and comfortable, minimally verbal this AM Respiratory system: C Respiratory effort normal. Cardiovascular system: S1 & S2 heard, RRR. No JVD, murmurs, rubs, gallops or clicks. No pedal edema. Gastrointestinal system: Abdomen is nondistended, soft and nontender. No organomegaly or masses felt. Normal bowel sounds heard. Neuro: Severe upper extremity weakness, confusion,   Data Reviewed: I have personally reviewed following labs and imaging studies  CBC:  Recent Labs Lab 12/31/2016 1924 12/15/16 0400  WBC 9.9 10.0  NEUTROABS 6.2  --   HGB 10.8* 10.7*  HCT 32.7* 32.6*  MCV 80.0 80.1  PLT 65* 62*   Basic Metabolic Panel:  Recent Labs Lab 12/22/2016 1924 12/15/16 0400  NA 128*  127*  K 3.1* 2.9*  CL 91* 91*  CO2 27 26  GLUCOSE 102* 99  BUN <5* <5*  CREATININE 0.46* 0.49*  CALCIUM 8.8* 8.7*  MG 1.7  --    Liver Function Tests:  Recent Labs Lab 12/07/2016 1924  AST 17  ALT 9*  ALKPHOS 71  BILITOT 0.6  PROT 5.9*  ALBUMIN 3.0*   Coagulation Profile:  Recent Labs Lab 12/28/2016 1924  INR 1.07   Thyroid Function Tests:  Recent Labs  12/19/2016 1924  TSH 1.571   Recent Results (from the past 240 hour(s))  MRSA PCR Screening     Status: None   Collection Time: 12/15/2016  4:28 PM  Result Value Ref Range Status    MRSA by PCR NEGATIVE NEGATIVE Final    Comment:        The GeneXpert MRSA Assay (FDA approved for NASAL specimens only), is one component of a comprehensive MRSA colonization surveillance program. It is not intended to diagnose MRSA infection nor to guide or monitor treatment for MRSA infections.       Radiology Studies: [name] [id number] Contrast  Result Date: 12/21/2016 CLINICAL DATA:  Progressive weakness EXAM: MRI HEAD WITHOUT CONTRAST MRA HEAD WITHOUT CONTRAST TECHNIQUE: Multiplanar, multiecho pulse sequences of the brain and surrounding structures were obtained without intravenous contrast. Angiographic images of the head were obtained using MRA technique without contrast. COMPARISON:  None. FINDINGS: MRI HEAD FINDINGS Brain: No focal diffusion restriction to indicate acute infarct. No intraparenchymal hemorrhage. There is mild hyperintense T2-weighted signal within the periventricular white matter, most often seen in the setting of chronic microvascular ischemia. No mass lesion or midline shift. No hydrocephalus or extra-axial fluid collection. The midline structures are normal. Atrophy is mildly advanced for age. Vascular: Major intracranial arterial and venous sinus flow voids are preserved. No evidence of chronic microhemorrhage or amyloid angiopathy. Skull and upper cervical spine: The visualized skull base, calvarium, upper cervical spine and extracranial soft tissues are normal. Sinuses/Orbits: No fluid levels or advanced mucosal thickening. No mastoid effusion. Normal orbits. MRA HEAD FINDINGS Intracranial internal carotid arteries: Projecting inferiorly from the communicating segment of the left internal carotid artery is a small outpouching that measures approximately 2 x 2 mm. Anterior cerebral arteries: There is an azygos anterior cerebral artery, which is a normal variant. Middle cerebral arteries: Normal. Posterior communicating arteries: Present on the right. Posterior cerebral  arteries: Normal. Basilar artery: Normal. Vertebral arteries: Codominant Normal. Superior cerebellar arteries: Normal. Anterior inferior cerebellar arteries: Normal. Posterior inferior cerebellar arteries: Normal. IMPRESSION: 1. No acute intracranial abnormality. 2. Mild chronic microvascular ischemia and mildly age advanced cerebral atrophy. 3. 2 mm outpouching from the communicating segment of the left internal carotid artery, likely infundibulum. Electronically Signed   By: Ulyses Jarred M.D.   On: 12/10/2016 21:37   [name] Contrast  Result Date: 12/18/2016 CLINICAL DATA:  Progressive weakness EXAM: MRI HEAD WITHOUT CONTRAST MRA HEAD WITHOUT CONTRAST TECHNIQUE: Multiplanar, multiecho pulse sequences of the brain and surrounding structures were obtained without intravenous contrast. Angiographic images of the head were obtained using MRA technique without contrast. COMPARISON:  None. FINDINGS: MRI HEAD FINDINGS Brain: No focal diffusion restriction to indicate acute infarct. No intraparenchymal hemorrhage. There is mild hyperintense T2-weighted signal within the periventricular white matter, most often seen in the setting of chronic microvascular ischemia. No mass lesion or midline shift. No hydrocephalus or extra-axial fluid collection. The midline structures are normal. Atrophy is mildly advanced for age. Vascular: Major  intracranial arterial and venous sinus flow voids are preserved. No evidence of chronic microhemorrhage or amyloid angiopathy. Skull and upper cervical spine: The visualized skull base, calvarium, upper cervical spine and extracranial soft tissues are normal. Sinuses/Orbits: No fluid levels or advanced mucosal thickening. No mastoid effusion. Normal orbits. MRA HEAD FINDINGS Intracranial internal carotid arteries: Projecting inferiorly from the communicating segment of the left internal carotid artery is a small outpouching that measures approximately 2 x 2 mm. Anterior cerebral  arteries: There is an azygos anterior cerebral artery, which is a normal variant. Middle cerebral arteries: Normal. Posterior communicating arteries: Present on the right. Posterior cerebral arteries: Normal. Basilar artery: Normal. Vertebral arteries: Codominant Normal. Superior cerebellar arteries: Normal. Anterior inferior cerebellar arteries: Normal. Posterior inferior cerebellar arteries: Normal. IMPRESSION: 1. No acute intracranial abnormality. 2. Mild chronic microvascular ischemia and mildly age advanced cerebral atrophy. 3. 2 mm outpouching from the communicating segment of the left internal carotid artery, likely infundibulum. Electronically Signed   By: Ulyses Jarred M.D.   On: 12/30/2016 21:37   Mr Cervical Spine Wo Contrast  Result Date: 12/15/2016 CLINICAL DATA:  Bilateral arm weakness, right greater than left EXAM: MRI CERVICAL SPINE WITHOUT CONTRAST TECHNIQUE: Multiplanar, multisequence MR imaging of the cervical spine was performed. No intravenous contrast was administered. COMPARISON:  None. FINDINGS: Alignment: Normal Vertebrae: No acute compression fracture, discitis-osteomyelitis, facet edema or other focal marrow lesion. No epidural collection. Cord: Normal signal and morphology. Posterior Fossa, vertebral arteries, paraspinal tissues: Negative. Disc levels: C1-C2: Normal. C2-C3: Normal disc space and facets. No spinal canal or neuroforaminal stenosis. C3-C4: Normal disc space and facets. No spinal canal or neuroforaminal stenosis. C4-C5: Normal disc space and facets. No spinal canal or neuroforaminal stenosis. C5-C6: Normal disc space and facets. No spinal canal or neuroforaminal stenosis. C6-C7: Mild bilateral uncovertebral hypertrophy with mild bilateral foraminal narrowing, slightly worse on the right. C7-T1: Normal disc space and facets. No spinal canal or neuroforaminal stenosis. IMPRESSION: Mild foraminal narrowing at C6-C7 bilaterally due to uncovertebral hypertrophy. Otherwise  normal MRI of cervical spine. Electronically Signed   By: Ulyses Jarred M.D.   On: 12/13/2016 21:43   Dg Chest Port 1 View  Result Date: 12/23/2016 CLINICAL DATA:  Check port placement EXAM: PORTABLE CHEST 1 VIEW COMPARISON:  None. FINDINGS: Cardiac shadow is within normal limits. Right chest wall port is noted with catheter tip at the cavoatrial junction. Emphysematous changes are noted most marked in the medial right upper lung. Mild fibrotic changes are noted in the bases. No acute bony abnormality is seen. IMPRESSION: Right chest wall port in satisfactory position. Emphysematous changes as described. Electronically Signed   By: Inez Catalina M.D.   On: 12/13/2016 21:57    Scheduled Meds: . cephALEXin  500 mg Oral Q8H  . cholecalciferol  1,000 Units Oral Daily  . feeding supplement (ENSURE ENLIVE)  237 mL Oral TID WC  . ferrous sulfate  325 mg Oral Q breakfast  . levofloxacin  500 mg Oral Daily  . potassium chloride  40 mEq Oral BID  . saccharomyces boulardii  500 mg Oral BID  . sertraline  25 mg Oral Daily   Continuous Infusions:   LOS: 0 days   Time spent: 20 minutes   Faye Ramsay, MD [facility] Pager [phone]  If 7PM-7AM, please contact night-coverage [email or web address] Password [email or web address] 12/15/2016, 6:07 PM

## 2016-12-16 ENCOUNTER — Observation Stay (HOSPITAL_COMMUNITY): Payer: Managed Care, Other (non HMO)

## 2016-12-16 ENCOUNTER — Other Ambulatory Visit (HOSPITAL_COMMUNITY): Payer: Managed Care, Other (non HMO)

## 2016-12-16 DIAGNOSIS — R092 Respiratory arrest: Secondary | ICD-10-CM | POA: Diagnosis not present

## 2016-12-16 LAB — CBC WITH DIFFERENTIAL/PLATELET
BASOS PCT: 0 %
Basophils Absolute: 0 10*3/uL (ref 0.0–0.1)
Eosinophils Absolute: 0.4 10*3/uL (ref 0.0–0.7)
Eosinophils Relative: 2 %
HCT: 34.2 % — ABNORMAL LOW (ref 39.0–52.0)
HEMOGLOBIN: 11.3 g/dL — AB (ref 13.0–17.0)
LYMPHS PCT: 6 %
Lymphs Abs: 1.1 10*3/uL (ref 0.7–4.0)
MCH: 27 pg (ref 26.0–34.0)
MCHC: 33 g/dL (ref 30.0–36.0)
MCV: 81.6 fL (ref 78.0–100.0)
Monocytes Absolute: 3.4 10*3/uL — ABNORMAL HIGH (ref 0.1–1.0)
Monocytes Relative: 18 %
NEUTROS PCT: 74 %
Neutro Abs: 13.8 10*3/uL — ABNORMAL HIGH (ref 1.7–7.7)
Platelets: 86 10*3/uL — ABNORMAL LOW (ref 150–400)
RBC: 4.19 MIL/uL — ABNORMAL LOW (ref 4.22–5.81)
RDW: 15.2 % (ref 11.5–15.5)
WBC: 18.7 10*3/uL — ABNORMAL HIGH (ref 4.0–10.5)

## 2016-12-16 LAB — BASIC METABOLIC PANEL
ANION GAP: 11 (ref 5–15)
CALCIUM: 9.1 mg/dL (ref 8.9–10.3)
CO2: 28 mmol/L (ref 22–32)
Chloride: 90 mmol/L — ABNORMAL LOW (ref 101–111)
Creatinine, Ser: 0.5 mg/dL — ABNORMAL LOW (ref 0.61–1.24)
GFR calc Af Amer: >60 mL/min (ref >60)
GFR calc non Af Amer: >60 mL/min (ref >60)
GLUCOSE: 126 mg/dL — AB (ref 65–99)
Potassium: 4.2 mmol/L (ref 3.5–5.1)
Sodium: 129 mmol/L — ABNORMAL LOW (ref 135–145)

## 2016-12-16 LAB — MAGNESIUM: Magnesium: 1.7 mg/dL (ref 1.7–2.4)

## 2016-12-16 MED ORDER — IPRATROPIUM-ALBUTEROL 0.5-2.5 (3) MG/3ML IN SOLN
RESPIRATORY_TRACT | Status: AC
  Administered 2016-12-16: 3 mL via RESPIRATORY_TRACT
  Filled 2016-12-16: qty 3

## 2016-12-16 MED ORDER — IPRATROPIUM-ALBUTEROL 0.5-2.5 (3) MG/3ML IN SOLN
3.0000 mL | Freq: Four times a day (QID) | RESPIRATORY_TRACT | Status: DC | PRN
  Administered 2016-12-16: 3 mL via RESPIRATORY_TRACT

## 2016-12-16 MED ORDER — MORPHINE SULFATE (PF) 2 MG/ML IV SOLN
1.0000 mg | INTRAVENOUS | Status: DC | PRN
  Administered 2016-12-16: 1 mg via INTRAVENOUS
  Filled 2016-12-16: qty 1

## 2017-01-03 NOTE — Progress Notes (Signed)
SLP Cancellation Note  Patient Details Name: Philip Fletcher MRN: [id number] DOB: May 13, 1961   Cancelled treatment:       Reason Eval/Treat Not Completed: Other (comment) (pt deceased). SLP received consult for swallow evaluation January 07, 2017. Pt on regular diet with thin liquids per MD. Consulted RN who reports patient deceased as of ~7:30 this morning. SLP signing off.   Deneise Lever, [place] CF-SLP Speech-Language Pathologist  Aliene Altes Jan 07, 2017, 12:19 PM

## 2017-01-03 NOTE — Progress Notes (Signed)
At 0445 pt complained of pain and anxiety. I medicated him with xanax and oxycodone. About a half hour later family reported he was short of breath. Oxygen saturation was in the 60s on room air. I placed him on 4 liters nasal cannula which only brought him to 80%. Bp=187/103, pulse=108. Respiratory therapy and rapid response RN Nevin Bloodgood and on call MD Hugelmeyer notified. STAT chest xray, CBC, duo nebs, continuous pulse ox and telemetry ordered. Sinus Tach on tele. Pt placed on nonrebreather at 15L but sats only came up to 89%. Pt's wife and daughter at bedside requested that pt be made a DNR/DNI. This change in code status was placed by Hugelmeyer. Bipap ordered with transfer requested to a stepdown unit. Pt diaphoretic, unable to speak, sats dropped to 83% with agonal breathing. family decided not to initiate bipap and so we cancelled transfer and prepared for comfort measures. Blood pressures began dropping down till 88/60. Morphine ordered q 1hr for comfort and I gave one dose. Report given to oncoming RN Sharyn Lull. At [place] with both of [place] present pt stopped breathing and we counted a full minute feeling for a pulse without feeling one. MD Olen Pel notified to pronounce death.

## 2017-01-03 NOTE — Discharge Summary (Signed)
Death Summary  Philip Fletcher [id number] DOB: 1961/10/22 DOA: December 22, 2016  PCP: Buckner Malta, MD  Admit date: 22-Dec-2016 Date of Death: 24-Dec-2016 Time of Death: ~7:30 am  Notification: Buckner Malta, MD notified of death   Brief Narrative:  56 y.o.malewith medical history significant of Fournier gangrene, rectourethral fistula, s/p indwelling suprapubic catheter, T-cell leukemia, HTN, alcoholism, gout, PUD, prostate CA, Iron deficiency anemia who was transferred to the [facility] d/t new onset bilateral arm weakness, right > left.  Patient was diagnosed with Fournier scrotal gangrene in January complicated by sepsis and necrotizing fasciatis, hospitalized to [facility] and underwent two debridement by urology. He was treated with broad spectrum antibiotics IV - Rocephin and Flagyl, but was improving slowly. His has suprapubic cathter d/t rectourethral fistula.   Assessment & Plan:  Acute respiratory arrest - appears to have occurred several hours after administration of benzo' and pain medication - per discussion with family at bedside, pt was in significant pain and has requested to be allowed comfort with no interventions such as intubation, wishes respected and pt made DNR - after record review, appears that CXR has demonstrated new developing PNA, left basilar area and WBC has also increased suggestive of potential aspiration PNA as well   Upper extremities weakness - right > left, acute encephalopathy  - per neurology team, DDx includes carcinomatous meningitis secondary to leukemia.  - Exam findings of bilateral mild ptosis, right lateral rectus palsy and impaired downgaze OU. Localization: Most likely corresponding cranial nerve roots (right CN VI and bilateral CN III) given MRI negative for stroke. DDx includes carcinomatous meningitis secondary to leukemia.   - Exam findings of severe bilateral upper extremity weakness with preserved left grip strength. 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 Possible underlying etiologies would include mass effect from bilateral lung apex masses or infection, autoimmune plexopathy and infiltrative process.  - family was aware of these findings and per pt's request, no further interventions were desired   Fournier gangrene - was on ABX   Rectourethral fistula - with some bleeding noted  - urology consulted, explained that cystoscopy would have been needed but pt and family have made request for comfort measures, their wishes have been respected   Hypokalemia - supplemented   Hyponatremia  Depression/ Anxiety - family and pt asked to be given xanax to ensure comfort   Anemia - iron deficiency, thrombocytopenia   Severe protein calorie malnutrition  Consultants:   Neurology  Urology   Procedures:   None  Antimicrobials:   Cephalexin December 22, 2022 -->  Levaquin 12-22-22 -->   The results of significant diagnostics from this hospitalization (including imaging, microbiology, ancillary and laboratory) are listed below for reference.    Significant Diagnostic Studies: [name] [id number] Contrast  Result Date: 12-22-16 CLINICAL DATA:  Progressive weakness EXAM: MRI HEAD WITHOUT CONTRAST MRA HEAD WITHOUT CONTRAST TECHNIQUE: Multiplanar, multiecho pulse sequences of the brain and surrounding structures were obtained without intravenous contrast. Angiographic images of the head were obtained using MRA technique without contrast. COMPARISON:  None. FINDINGS: MRI HEAD FINDINGS Brain: No focal diffusion restriction to indicate acute infarct. No intraparenchymal hemorrhage. There is mild hyperintense T2-weighted signal within the periventricular white matter, most often seen in the setting of chronic microvascular  ischemia. No mass lesion or midline shift. No hydrocephalus or extra-axial fluid  collection. The midline structures are normal. Atrophy is mildly advanced for age. Vascular: Major intracranial arterial and venous sinus flow voids are preserved. No evidence of chronic microhemorrhage or amyloid angiopathy. Skull and upper cervical spine: The visualized skull base, calvarium, upper cervical spine and extracranial soft tissues are normal. Sinuses/Orbits: No fluid levels or advanced mucosal thickening. No mastoid effusion. Normal orbits. MRA HEAD FINDINGS Intracranial internal carotid arteries: Projecting inferiorly from the communicating segment of the left internal carotid artery is a small outpouching that measures approximately 2 x 2 mm. Anterior cerebral arteries: There is an azygos anterior cerebral artery, which is a normal variant. Middle cerebral arteries: Normal. Posterior communicating arteries: Present on the right. Posterior cerebral arteries: Normal. Basilar artery: Normal. Vertebral arteries: Codominant Normal. Superior cerebellar arteries: Normal. Anterior inferior cerebellar arteries: Normal. Posterior inferior cerebellar arteries: Normal. IMPRESSION: 1. No acute intracranial abnormality. 2. Mild chronic microvascular ischemia and mildly age advanced cerebral atrophy. 3. 2 mm outpouching from the communicating segment of the left internal carotid artery, likely infundibulum. Electronically Signed   By: Ulyses Jarred M.D.   On: 12/17/2016 21:37   [name] Contrast  Result Date: 12/23/2016 CLINICAL DATA:  Progressive weakness EXAM: MRI HEAD WITHOUT CONTRAST MRA HEAD WITHOUT CONTRAST TECHNIQUE: Multiplanar, multiecho pulse sequences of the brain and surrounding structures were obtained without intravenous contrast. Angiographic images of the head were obtained using MRA technique without contrast. COMPARISON:  None. FINDINGS: MRI HEAD FINDINGS Brain: No focal diffusion restriction to indicate acute  infarct. No intraparenchymal hemorrhage. There is mild hyperintense T2-weighted signal within the periventricular white matter, most often seen in the setting of chronic microvascular ischemia. No mass lesion or midline shift. No hydrocephalus or extra-axial fluid collection. The midline structures are normal. Atrophy is mildly advanced for age. Vascular: Major intracranial arterial and venous sinus flow voids are preserved. No evidence of chronic microhemorrhage or amyloid angiopathy. Skull and upper cervical spine: The visualized skull base, calvarium, upper cervical spine and extracranial soft tissues are normal. Sinuses/Orbits: No fluid levels or advanced mucosal thickening. No mastoid effusion. Normal orbits. MRA HEAD FINDINGS Intracranial internal carotid arteries: Projecting inferiorly from the communicating segment of the left internal carotid artery is a small outpouching that measures approximately 2 x 2 mm. Anterior cerebral arteries: There is an azygos anterior cerebral artery, which is a normal variant. Middle cerebral arteries: Normal. Posterior communicating arteries: Present on the right. Posterior cerebral arteries: Normal. Basilar artery: Normal. Vertebral arteries: Codominant Normal. Superior cerebellar arteries: Normal. Anterior inferior cerebellar arteries: Normal. Posterior inferior cerebellar arteries: Normal. IMPRESSION: 1. No acute intracranial abnormality. 2. Mild chronic microvascular ischemia and mildly age advanced cerebral atrophy. 3. 2 mm outpouching from the communicating segment of the left internal carotid artery, likely infundibulum. Electronically Signed   By: Ulyses Jarred M.D.   On: 12/11/2016 21:37   Mr Cervical Spine Wo Contrast  Result Date: 12/13/2016 CLINICAL DATA:  Bilateral arm weakness, right greater than left EXAM: MRI CERVICAL SPINE WITHOUT CONTRAST TECHNIQUE: Multiplanar, multisequence MR imaging of the cervical spine was performed. No intravenous contrast was  administered. COMPARISON:  None. FINDINGS: Alignment: Normal Vertebrae: No acute compression fracture, discitis-osteomyelitis, facet edema or other focal marrow lesion. No epidural collection. Cord: Normal signal and morphology. Posterior Fossa, vertebral arteries, paraspinal tissues: Negative. Disc levels: C1-C2: Normal. C2-C3: Normal disc space and facets. No spinal canal or neuroforaminal stenosis. C3-C4: Normal disc space and facets. No spinal canal or neuroforaminal stenosis. C4-C5: Normal disc space and facets. No spinal  canal or neuroforaminal stenosis. C5-C6: Normal disc space and facets. No spinal canal or neuroforaminal stenosis. C6-C7: Mild bilateral uncovertebral hypertrophy with mild bilateral foraminal narrowing, slightly worse on the right. C7-T1: Normal disc space and facets. No spinal canal or neuroforaminal stenosis. IMPRESSION: Mild foraminal narrowing at C6-C7 bilaterally due to uncovertebral hypertrophy. Otherwise normal MRI of cervical spine. Electronically Signed   By: Ulyses Jarred M.D.   On: 12/18/2016 21:43   Dg Chest Port 1 View  Result Date: Jan 15, 2017 CLINICAL DATA:  56 y/o  M; shortness of breath. EXAM: PORTABLE CHEST 1 VIEW COMPARISON:  12/19/2016 chest radiograph. FINDINGS: The patient is rotated. Stable cardiac silhouette given projection and technique. Right port catheter tip projects over the lower SVC. Severe emphysema with apical bullous changes. Interval development of a left basilar opacity. Bones are unremarkable. IMPRESSION: Severe emphysema with apical bullous changes. Interval development of a left basilar opacity which may represent atelectasis and/or effusion. Pneumonia is not excluded. Electronically Signed   By: Kristine Garbe M.D.   On: 2017/01/15 06:27   Dg Chest Port 1 View  Result Date: 12/24/2016 CLINICAL DATA:  Check port placement EXAM: PORTABLE CHEST 1 VIEW COMPARISON:  None. FINDINGS: Cardiac shadow is within normal limits. Right chest wall  port is noted with catheter tip at the cavoatrial junction. Emphysematous changes are noted most marked in the medial right upper lung. Mild fibrotic changes are noted in the bases. No acute bony abnormality is seen. IMPRESSION: Right chest wall port in satisfactory position. Emphysematous changes as described. Electronically Signed   By: Inez Catalina M.D.   On: 12/19/2016 21:57    Microbiology: Recent Results (from the past 240 hour(s))  MRSA PCR Screening     Status: None   Collection Time: 12/20/2016  4:28 PM  Result Value Ref Range Status   MRSA by PCR NEGATIVE NEGATIVE Final    Comment:        The GeneXpert MRSA Assay (FDA approved for NASAL specimens only), is one component of a comprehensive MRSA colonization surveillance program. It is not intended to diagnose MRSA infection nor to guide or monitor treatment for MRSA infections.      Labs: Basic Metabolic Panel:  Recent Labs Lab 12/24/2016 1924 12/15/16 0400 Jan 15, 2017 0402  NA 128* 127* 129*  K 3.1* 2.9* 4.2  CL 91* 91* 90*  CO2 27 26 28   GLUCOSE 102* 99 126*  BUN <5* <5* <5*  CREATININE 0.46* 0.49* 0.50*  CALCIUM 8.8* 8.7* 9.1  MG 1.7  --  1.7   Liver Function Tests:  Recent Labs Lab 12/22/2016 1924  AST 17  ALT 9*  ALKPHOS 71  BILITOT 0.6  PROT 5.9*  ALBUMIN 3.0*   CBC:  Recent Labs Lab 12/27/2016 1924 12/15/16 0400 January 15, 2017 0633  WBC 9.9 10.0 18.7*  NEUTROABS 6.2  --  13.8*  HGB 10.8* 10.7* 11.3*  HCT 32.7* 32.6* 34.2*  MCV 80.0 80.1 81.6  PLT 65* 62* 86*    SIGNED:  Faye Ramsay, MD  [facility] January 15, 2017, 8:19 AM Pager [phone]  If 7PM-7AM, please contact night-coverage [email or web address] Password [email or web address]

## 2017-01-03 NOTE — Significant Event (Signed)
Rapid Response Event Note  Called by Arsenio Loader, RN for pt  In respiratory distress with O2 sats 67% on RA, Placed on 3l [place] with sats 80%  Overview: Time Called: 0513 Arrival Time: 0520 Event Type: Respiratory  Initial Focused Assessment:  Upon arrival to room Philip Fletcher, RT at bedsidee placing pt on NRM at 15 L.  Pt awake, able to state name and knows he is in the hospital.  Moderate increased WOB with RR 16 and sternal retractions.  Bilateral breath sounds diminished anteriorly and posteriorly. VS: 187/103  115 16. Pt received Xanax .5 mg po for anxiety  and Oxy IR 10 mg po at 0445 for general discomfort.  Colostomy with maroon liquid noted.  Wife and daughter at bedside requesting pt be made DNR  Interventions: Dr Ara Kussmaul informed of family desire for DNR.  Code status changed. Duoneb treatment given by RT per order. Stat PCXR,: left atelectasisvs effusion, vs PNA.  placed on continuous pulse ox and telemetry. Bipap ordered by Dr Ara Kussmaul and transfer to SDU [phone]  Daughter  requesting hold on BIPAP until wife returns from break..  O2 sats 82%, with agonal breathing.  RR 22 98/63 [id number] Wife returned to bedside and is in agreement with other family member not to initiate BIPAP.  Pt given Morphine SO4 1 mg for comfort.  Will continue to support pt and family at this time. Event Summary: Name of Physician Notified: Harvie Bridge, DO at [address], [place]

## 2017-01-03 DEATH — deceased

## 2017-12-05 IMAGING — MR MR MRA HEAD W/O CM
10 of 12 series · 32 of 48 positions shown · non-contrast
Comparison: None.

CLINICAL DATA: Progressive weakness

EXAM:
MRI HEAD WITHOUT CONTRAST
MRA HEAD WITHOUT CONTRAST
TECHNIQUE: Multiplanar, multiecho pulse sequences of the brain and surrounding
structures were obtained without intravenous contrast. Angiographic
images of the head were obtained using MRA technique without
contrast.

[Series 3: DWI · axial · 3.0mm · 1.09mm/px · z∈[-25,+111]mm · 6 of 96 slices shown (1 of 4)]
[im 1/96]
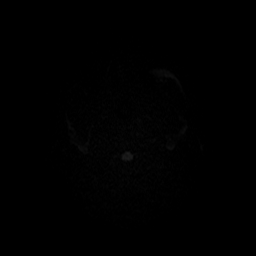
[im 20/96]
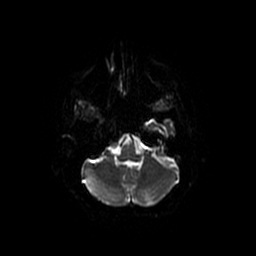
[im 39/96]
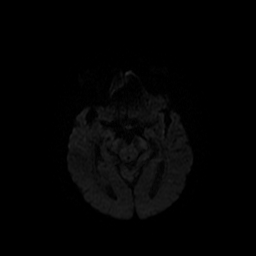
[im 58/96]
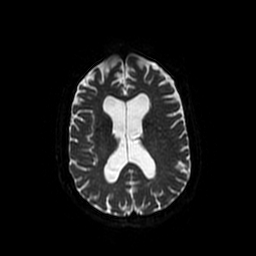
[im 77/96]
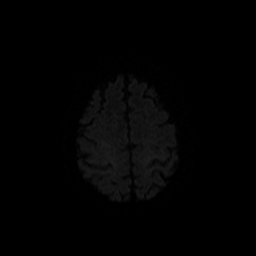
[im 96/96]
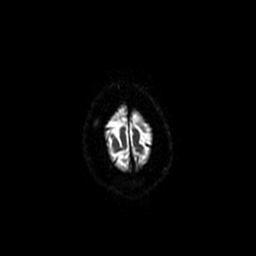

[Series 4: DWI · coronal · 5.0mm · 1.09mm/px · 6 of 76 slices shown (2 of 4)]
[im 1/76]
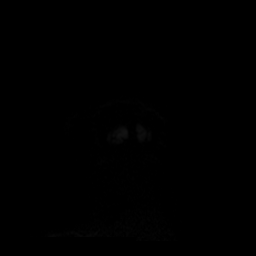
[im 16/76]
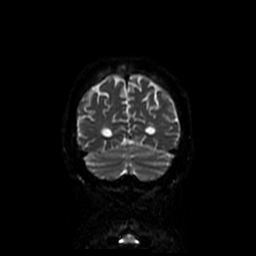
[im 31/76]
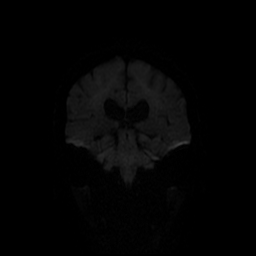
[im 46/76]
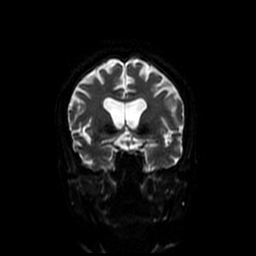
[im 61/76]
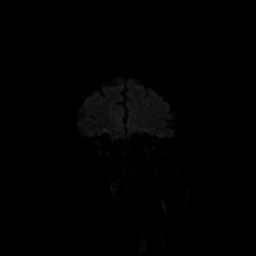
[im 76/76]
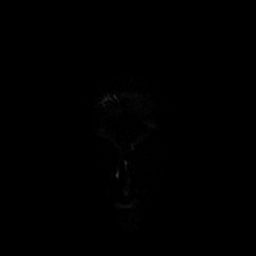

[Series 5: ax mpgr · axial · 5.0mm · 0.47mm/px · z∈[-27,+105]mm · 2 of 24 slices shown]
[im 1/24]
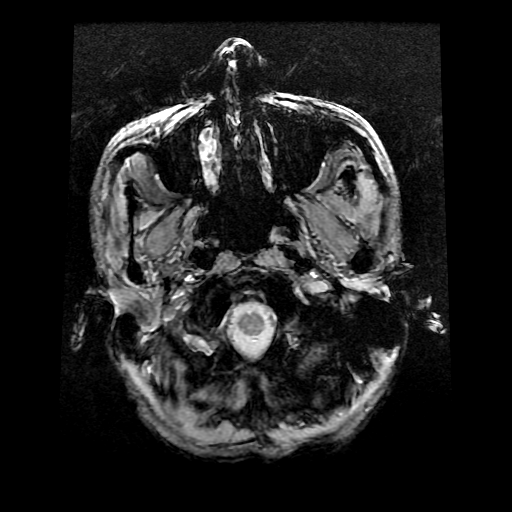
[im 24/24]
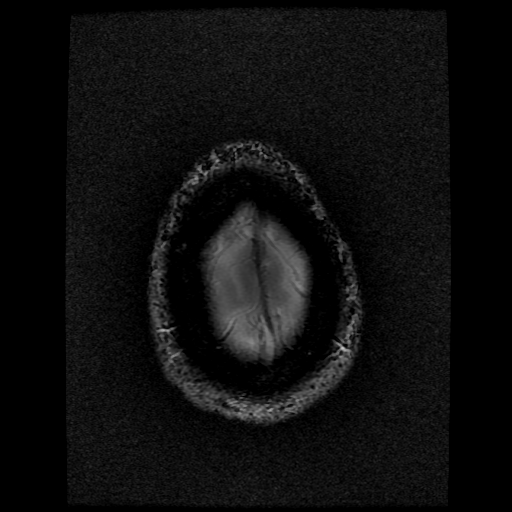

[Series 6: (id) mt fs · axial · 1.4mm · 0.43mm/px · z∈[-37,-10]mm · 3 of 136 slices shown]
[im 1/136]
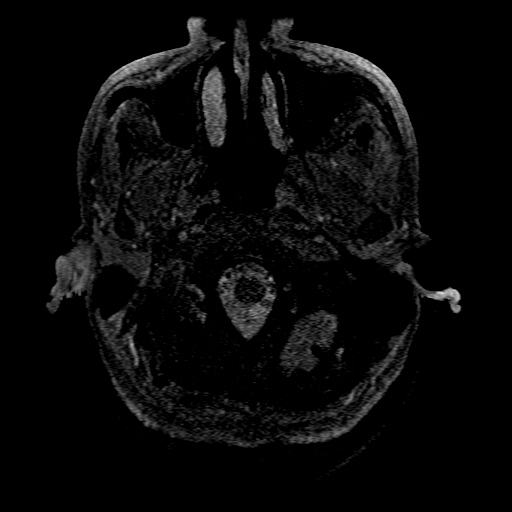
[im 28/136]
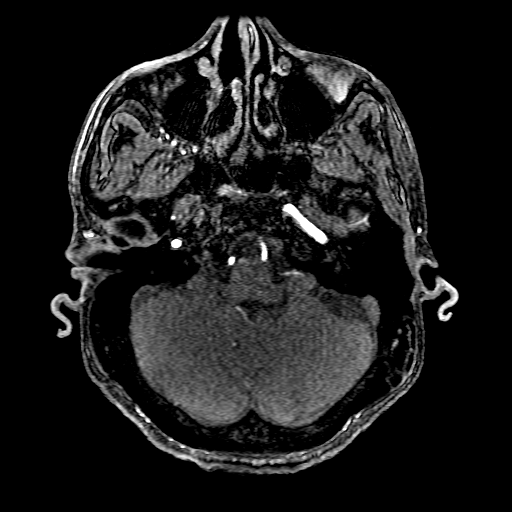
[im 41/136]
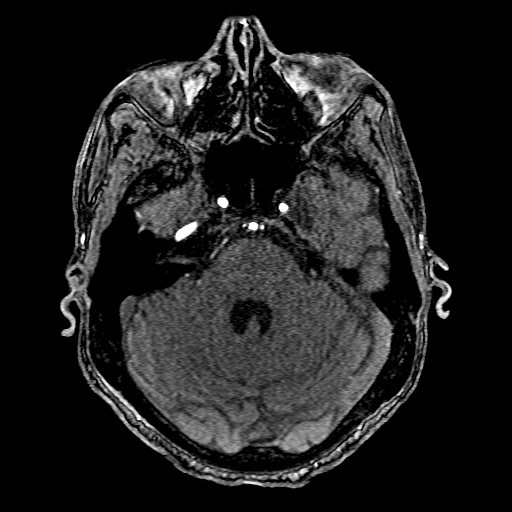

[Series 7: T2 · axial · 5.0mm · 0.47mm/px · z∈[-27,+105]mm · 2 of 24 slices shown (1 of 2)]
[im 1/24]
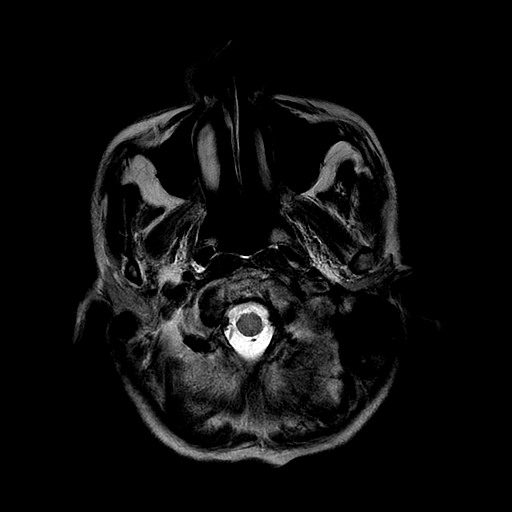
[im 24/24]
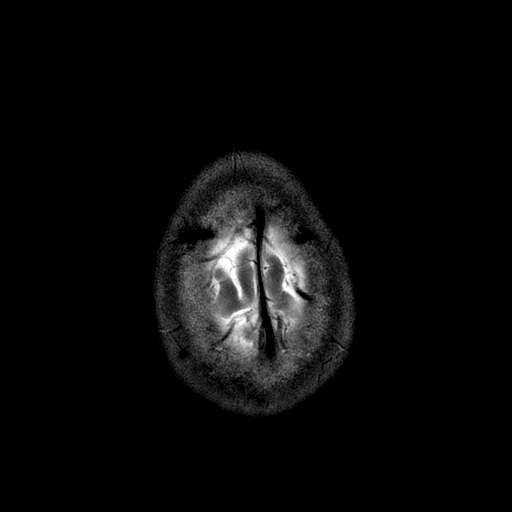

[Series 9: T1 · sagittal · 5.0mm · 0.47mm/px · 2 of 23 slices shown]
[im 1/23]
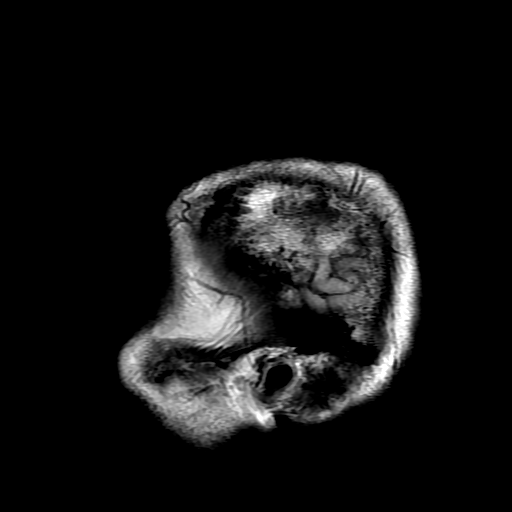
[im 23/23]
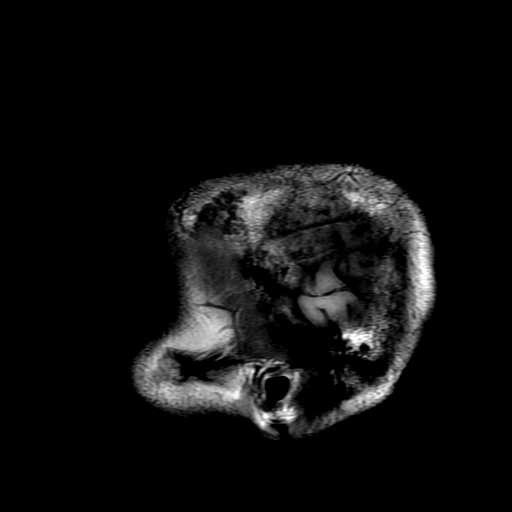

[Series 10: FLAIR · axial · 5.0mm · 0.47mm/px · z∈[-27,+105]mm · 2 of 24 slices shown]
[im 1/24]
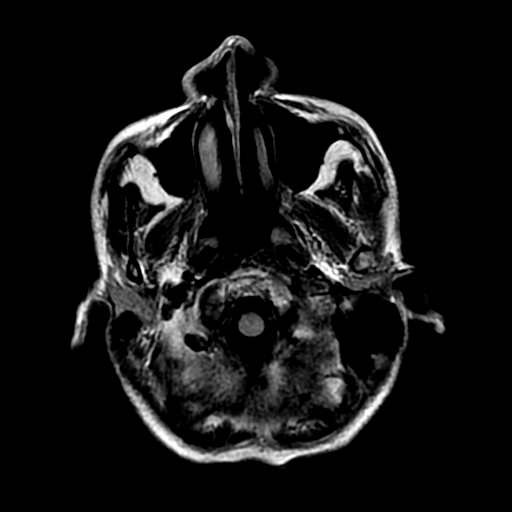
[im 24/24]
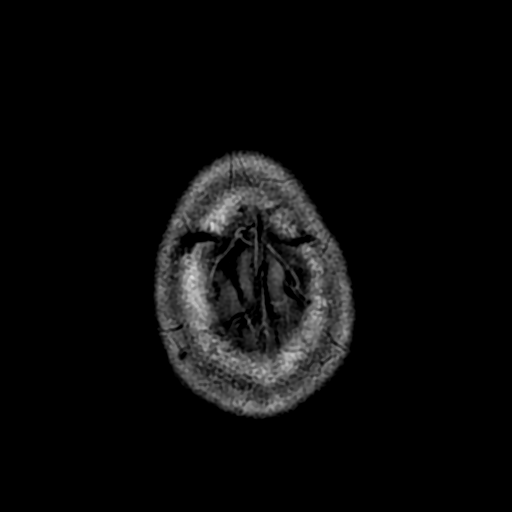

[Series 11: T2 · coronal · 5.0mm · 0.43mm/px · 2 of 32 slices shown (2 of 2)]
[im 1/32]
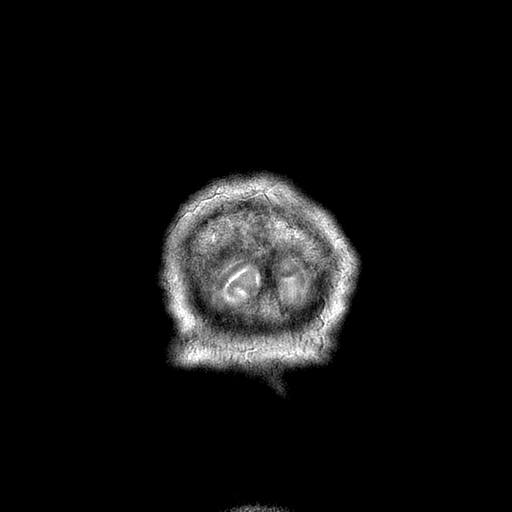
[im 32/32]
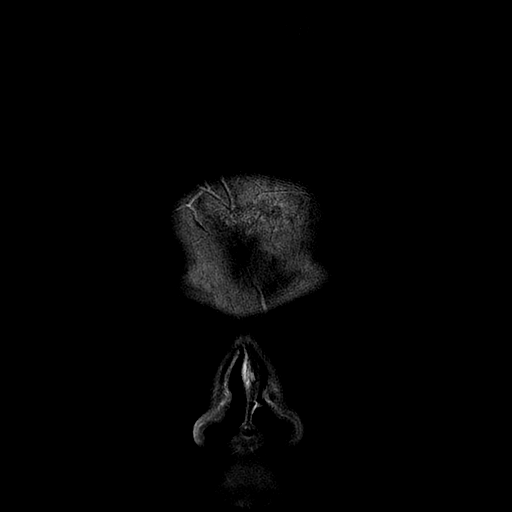

[Series 300: DWI · axial · 3.0mm · 1.09mm/px · z∈[-25,+111]mm · 4 of 48 slices shown (3 of 4)]
[im 1/48]
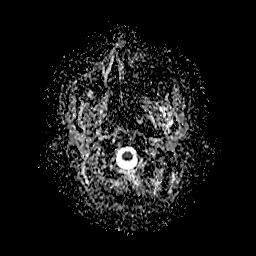
[im 16/48]
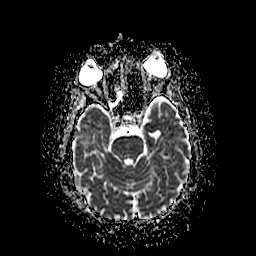
[im 32/48]
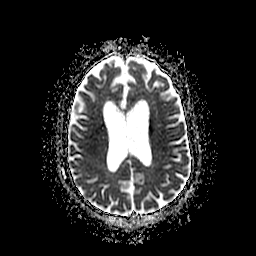
[im 48/48]
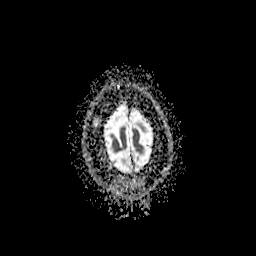

[Series 400: DWI · coronal · 5.0mm · 1.09mm/px · 3 of 38 slices shown (4 of 4)]
[im 1/38]
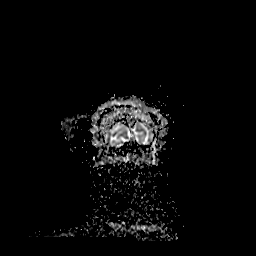
[im 19/38]
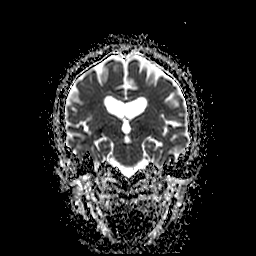
[im 38/38]
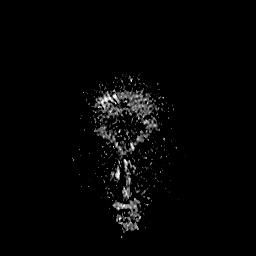

[32 of 48 positions shown; findings below may reference images not displayed]

FINDINGS: MRI HEAD FINDINGS

Brain: No focal diffusion restriction to indicate acute infarct. No
intraparenchymal hemorrhage. There is mild hyperintense T2-weighted
signal within the periventricular white matter, most often seen in
the setting of chronic microvascular ischemia. No mass lesion or
midline shift. No hydrocephalus or extra-axial fluid collection. The
midline structures are normal. Atrophy is mildly advanced for age.

Vascular: Major intracranial arterial and venous sinus flow voids
are preserved. No evidence of chronic microhemorrhage or amyloid
angiopathy.

Skull and upper cervical spine: The visualized skull base,
calvarium, upper cervical spine and extracranial soft tissues are
normal.

Sinuses/Orbits: No fluid levels or advanced mucosal thickening. No
mastoid effusion. Normal orbits.

MRA HEAD FINDINGS

Intracranial internal carotid arteries: Projecting inferiorly from
the communicating segment of the left internal carotid artery is a
small outpouching that measures approximately 2 x 2 mm.

Anterior cerebral arteries: There is an azygos anterior cerebral
artery, which is a normal variant.

Middle cerebral arteries: Normal.

Posterior communicating arteries: Present on the right.

Posterior cerebral arteries: Normal.

Basilar artery: Normal.

Vertebral arteries: Codominant Normal.

Superior cerebellar arteries: Normal.

Anterior inferior cerebellar arteries: Normal.

Posterior inferior cerebellar arteries: Normal.
IMPRESSION: 1. No acute intracranial abnormality.
2. Mild chronic microvascular ischemia and mildly age advanced
cerebral atrophy.
3. 2 mm outpouching from the communicating segment of the left
internal carotid artery, likely infundibulum.

## 2017-12-07 IMAGING — CR DG CHEST 1V PORT
1 series · 1 of 1 positions shown · non-contrast
Comparison: 12/14/2016 chest radiograph.

CLINICAL DATA: 56 y/o  M; shortness of breath.

EXAM:
PORTABLE CHEST 1 VIEW

[AP]
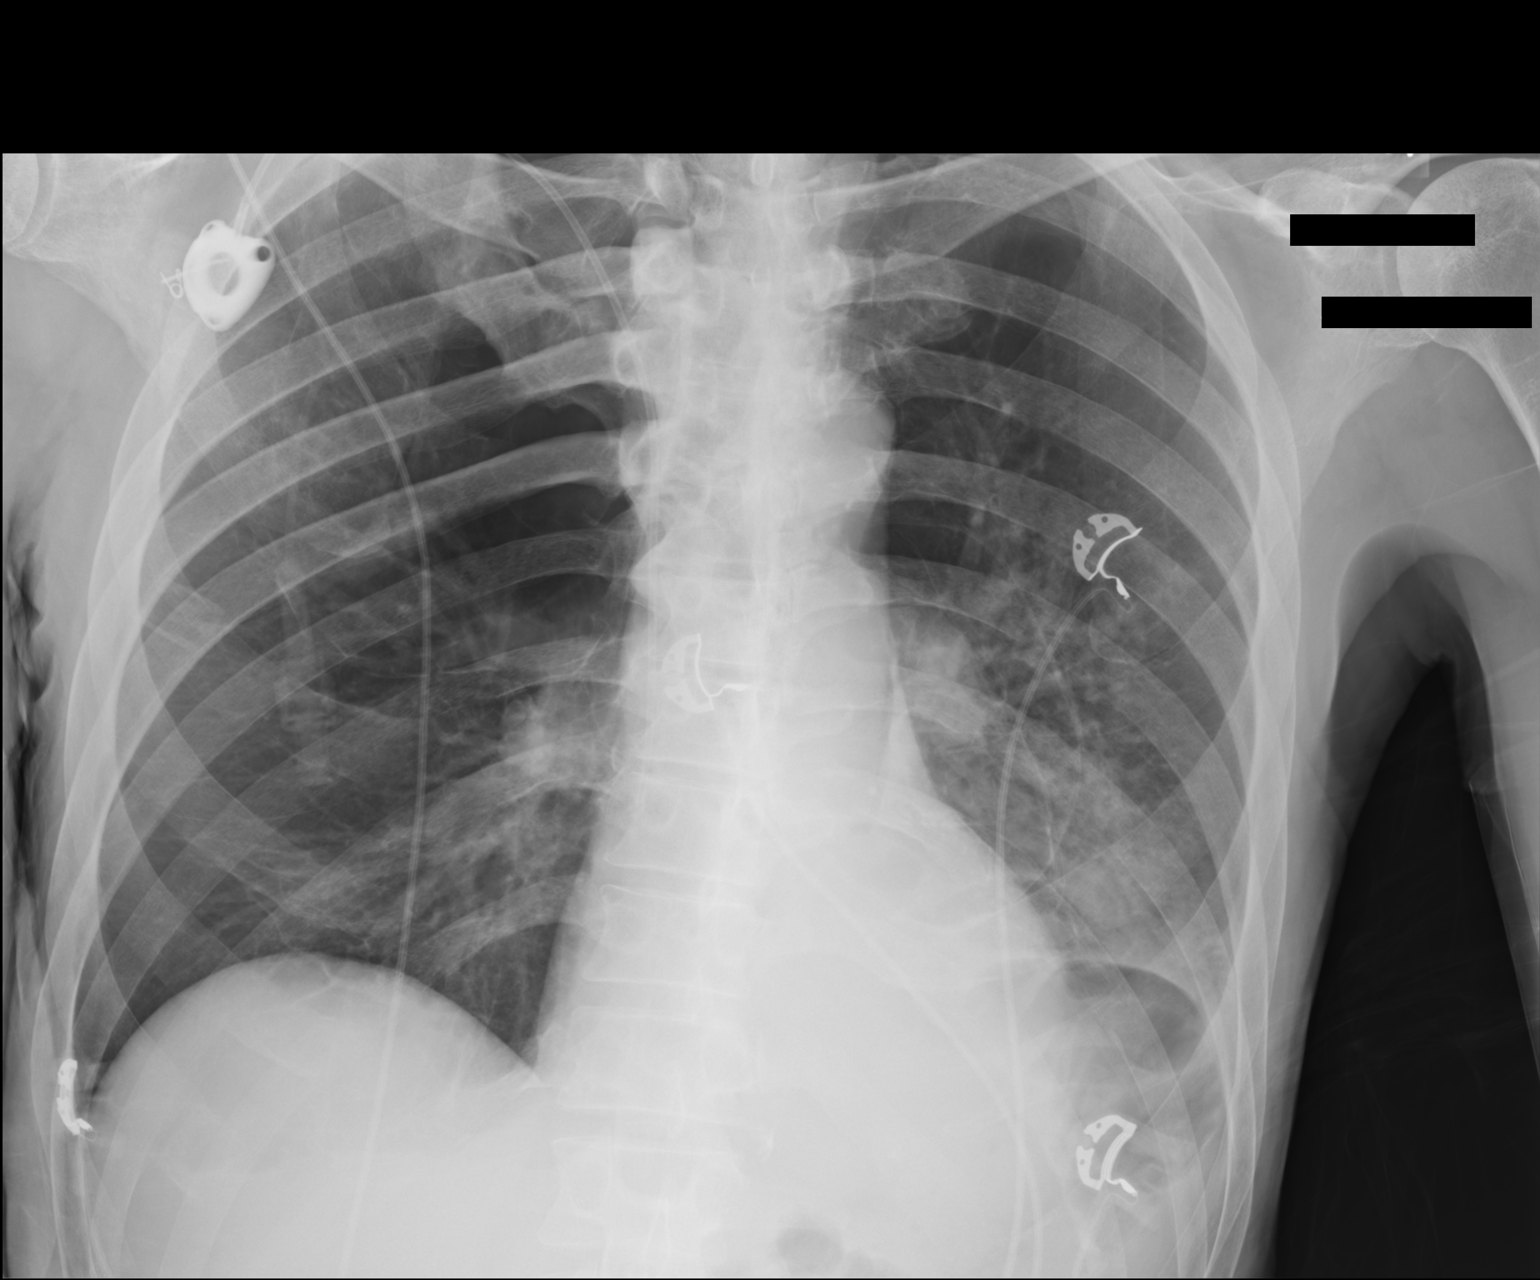

[1 of 1 positions shown; findings below may reference images not displayed]

FINDINGS: The patient is rotated. Stable cardiac silhouette given projection
and technique. Right port catheter tip projects over the lower SVC.
Severe emphysema with apical bullous changes. Interval development
of a left basilar opacity. Bones are unremarkable.
IMPRESSION: Severe emphysema with apical bullous changes. Interval development
of a left basilar opacity which may represent atelectasis and/or
effusion. Pneumonia is not excluded.

By: Legese Perold M.D.
